# Patient Record
Sex: Female | Born: 1985 | Race: Black or African American | Hispanic: No | Marital: Single | Smoking: Never smoker
Health system: Southern US, Community
[De-identification: ages and names within clinical notes are randomized; demographics above are authoritative.]

## PROBLEM LIST (undated history)

## (undated) DIAGNOSIS — Z789 Other specified health status: Secondary | ICD-10-CM

## (undated) HISTORY — PX: DILATION AND CURETTAGE OF UTERUS: SHX78

---
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
## 2005-02-26 ENCOUNTER — Ambulatory Visit (HOSPITAL_COMMUNITY): Admission: RE | Admit: 2005-02-26 | Discharge: 2005-02-26 | Payer: Self-pay | Admitting: Obstetrics & Gynecology

## 2005-05-03 ENCOUNTER — Ambulatory Visit (HOSPITAL_COMMUNITY): Admission: RE | Admit: 2005-05-03 | Discharge: 2005-05-03 | Payer: Self-pay | Admitting: Obstetrics & Gynecology

## 2005-06-04 ENCOUNTER — Inpatient Hospital Stay (HOSPITAL_COMMUNITY): Admission: AD | Admit: 2005-06-04 | Discharge: 2005-06-04 | Payer: Self-pay | Admitting: Obstetrics & Gynecology

## 2005-06-19 ENCOUNTER — Inpatient Hospital Stay (HOSPITAL_COMMUNITY): Admission: AD | Admit: 2005-06-19 | Discharge: 2005-06-21 | Payer: Self-pay | Admitting: Obstetrics

## 2005-07-30 ENCOUNTER — Emergency Department (HOSPITAL_COMMUNITY): Admission: EM | Admit: 2005-07-30 | Discharge: 2005-07-30 | Payer: Self-pay | Admitting: Emergency Medicine

## 2006-01-09 ENCOUNTER — Inpatient Hospital Stay (HOSPITAL_COMMUNITY): Admission: EM | Admit: 2006-01-09 | Discharge: 2006-01-10 | Payer: Self-pay | Admitting: Emergency Medicine

## 2006-01-10 ENCOUNTER — Inpatient Hospital Stay (HOSPITAL_COMMUNITY): Admission: AD | Admit: 2006-01-10 | Discharge: 2006-01-13 | Payer: Self-pay | Admitting: Psychiatry

## 2006-01-10 ENCOUNTER — Ambulatory Visit: Payer: Self-pay | Admitting: Psychiatry

## 2006-03-05 ENCOUNTER — Encounter: Admission: RE | Admit: 2006-03-05 | Discharge: 2006-06-03 | Payer: Self-pay | Admitting: Obstetrics & Gynecology

## 2006-04-24 ENCOUNTER — Emergency Department (HOSPITAL_COMMUNITY): Admission: EM | Admit: 2006-04-24 | Discharge: 2006-04-24 | Payer: Self-pay | Admitting: Family Medicine

## 2006-05-27 ENCOUNTER — Inpatient Hospital Stay (HOSPITAL_COMMUNITY): Admission: AD | Admit: 2006-05-27 | Discharge: 2006-05-28 | Payer: Self-pay | Admitting: Obstetrics & Gynecology

## 2006-05-31 ENCOUNTER — Ambulatory Visit (HOSPITAL_COMMUNITY): Admission: RE | Admit: 2006-05-31 | Discharge: 2006-05-31 | Payer: Self-pay | Admitting: Obstetrics & Gynecology

## 2006-07-11 ENCOUNTER — Emergency Department (HOSPITAL_COMMUNITY): Admission: EM | Admit: 2006-07-11 | Discharge: 2006-07-11 | Payer: Self-pay | Admitting: Family Medicine

## 2006-07-12 ENCOUNTER — Emergency Department (HOSPITAL_COMMUNITY): Admission: EM | Admit: 2006-07-12 | Discharge: 2006-07-12 | Payer: Self-pay | Admitting: Emergency Medicine

## 2006-07-23 ENCOUNTER — Observation Stay (HOSPITAL_COMMUNITY): Admission: EM | Admit: 2006-07-23 | Discharge: 2006-07-23 | Payer: Self-pay | Admitting: Emergency Medicine

## 2006-11-04 ENCOUNTER — Inpatient Hospital Stay (HOSPITAL_COMMUNITY): Admission: AD | Admit: 2006-11-04 | Discharge: 2006-11-05 | Payer: Self-pay | Admitting: Obstetrics

## 2006-11-05 ENCOUNTER — Emergency Department (HOSPITAL_COMMUNITY): Admission: EM | Admit: 2006-11-05 | Discharge: 2006-11-05 | Payer: Self-pay | Admitting: Emergency Medicine

## 2008-08-04 ENCOUNTER — Emergency Department (HOSPITAL_COMMUNITY): Admission: EM | Admit: 2008-08-04 | Discharge: 2008-08-04 | Payer: Self-pay | Admitting: Emergency Medicine

## 2008-10-06 ENCOUNTER — Emergency Department (HOSPITAL_COMMUNITY): Admission: EM | Admit: 2008-10-06 | Discharge: 2008-10-06 | Payer: Self-pay | Admitting: Emergency Medicine

## 2009-05-30 ENCOUNTER — Emergency Department (HOSPITAL_COMMUNITY): Admission: EM | Admit: 2009-05-30 | Discharge: 2009-05-30 | Payer: Self-pay | Admitting: Family Medicine

## 2010-07-23 LAB — POCT URINALYSIS DIP (DEVICE)
Bilirubin Urine: NEGATIVE
Glucose, UA: NEGATIVE mg/dL
Ketones, ur: NEGATIVE mg/dL
Nitrite: NEGATIVE
Protein, ur: NEGATIVE mg/dL
Specific Gravity, Urine: 1.02 (ref 1.005–1.030)
Urobilinogen, UA: 1 mg/dL (ref 0.0–1.0)
pH: 6 (ref 5.0–8.0)

## 2010-07-23 LAB — POCT PREGNANCY, URINE: Preg Test, Ur: NEGATIVE

## 2010-08-14 LAB — URINE MICROSCOPIC-ADD ON

## 2010-08-14 LAB — GC/CHLAMYDIA PROBE AMP, GENITAL: GC Probe Amp, Genital: NEGATIVE

## 2010-08-14 LAB — URINALYSIS, ROUTINE W REFLEX MICROSCOPIC
Bilirubin Urine: NEGATIVE
Glucose, UA: NEGATIVE mg/dL
Hgb urine dipstick: NEGATIVE
Ketones, ur: NEGATIVE mg/dL
Nitrite: NEGATIVE
Specific Gravity, Urine: 1.018 (ref 1.005–1.030)
pH: 7.5 (ref 5.0–8.0)

## 2010-08-14 LAB — POCT PREGNANCY, URINE: Preg Test, Ur: NEGATIVE

## 2010-08-14 LAB — WET PREP, GENITAL

## 2010-08-17 LAB — CBC
RBC: 4.43 MIL/uL (ref 3.87–5.11)
WBC: 6.1 10*3/uL (ref 4.0–10.5)

## 2010-08-17 LAB — COMPREHENSIVE METABOLIC PANEL
ALT: 11 U/L (ref 0–35)
AST: 18 U/L (ref 0–37)
Alkaline Phosphatase: 26 U/L — ABNORMAL LOW (ref 39–117)
CO2: 25 mEq/L (ref 19–32)
Chloride: 104 mEq/L (ref 96–112)
GFR calc Af Amer: >60 mL/min (ref >60)
GFR calc non Af Amer: >60 mL/min (ref >60)
Sodium: 134 mEq/L — ABNORMAL LOW (ref 135–145)
Total Bilirubin: 0.8 mg/dL (ref 0.3–1.2)

## 2010-08-17 LAB — POCT I-STAT, CHEM 8
BUN: 10 mg/dL (ref 6–23)
Calcium, Ion: 1.17 mmol/L (ref 1.12–1.32)
HCT: 37 % (ref 36.0–46.0)
TCO2: 24 mmol/L (ref 0–100)

## 2010-08-17 LAB — DIFFERENTIAL
Basophils Absolute: 0 10*3/uL (ref 0.0–0.1)
Eosinophils Absolute: 0.5 10*3/uL (ref 0.0–0.7)
Eosinophils Relative: 9 % — ABNORMAL HIGH (ref 0–5)

## 2010-08-17 LAB — URINE MICROSCOPIC-ADD ON

## 2010-08-17 LAB — URINALYSIS, ROUTINE W REFLEX MICROSCOPIC
Nitrite: NEGATIVE
Specific Gravity, Urine: 1.025 (ref 1.005–1.030)
Urobilinogen, UA: 1 mg/dL (ref 0.0–1.0)
pH: 6 (ref 5.0–8.0)

## 2010-08-17 LAB — LIPASE, BLOOD: Lipase: 22 U/L (ref 11–59)

## 2010-08-17 LAB — GC/CHLAMYDIA PROBE AMP, GENITAL: GC Probe Amp, Genital: POSITIVE — AB

## 2010-08-17 LAB — URINE CULTURE: Colony Count: 40000

## 2010-08-17 LAB — WET PREP, GENITAL: Yeast Wet Prep HPF POC: NONE SEEN

## 2010-09-22 NOTE — H&P (Signed)
NAMESERAH, [name]               ACCOUNT NO.:  [id number]   MEDICAL RECORD NO.:  [id number]          PATIENT TYPE:  EMS   LOCATION:  MAJO                         FACILITY:  [facility]   PHYSICIAN:  Elliot Cousin, M.D.    DATE OF BIRTH:  [birth year]   DATE OF ADMISSION:  01/09/2006  DATE OF DISCHARGE:                                HISTORY & PHYSICAL   PRIMARY CARE PHYSICIAN:  Robyn N. Allyne Gee, M.D.   CHIEF COMPLAINT:  I overdosed on Extra-Strength Tylenol.   HISTORY OF PRESENT ILLNESS:  The patient is a 25 year old female with no  significant past medical history, who presents to the emergency department  after taking 15 Tylenol Extra-Strength tablets.  The patient states that she  took the pills at approximately 2 p.m. today.  She also drank a cup of vodka  to wash it down. The patient has been somewhat depressed because of a poor  relationship with her daughter's father.  Over the past few days, they have  been arguing quite a bit.  She denies any previous history of suicide  attempts.  She did have a bout of postpartum depression which she says did  not require pharmacological treatment.   After she took the Tylenol, she called her best friend and told her.  Her  best friend called the patient's neighbor who then called the patient's  mother.  Subsequently they called EMS.  When the patient arrived to the  emergency department, she was hemodynamically stable.  She was given  Mucomyst by the emergency department physician.  She will be admitted for  further evaluation and management.   PAST MEDICAL HISTORY:  Gravida 1, para 1.   MEDICATIONS:  Oral contraceptive agent once daily.   ALLERGIES:  No known drug allergies.   SOCIAL HISTORY:  The patient is single.  She [place]  She has one daughter.  She completed high  school.  She denies tobacco and illicit drug use.  She drinks alcohol  occasionally.   FAMILY HISTORY:  Mother is 65  years of age and has asthma.  Her father is 42  years of age and has no significant past medical history.   REVIEW OF SYSTEMS:  Otherwise negative.   REVIEW OF SYSTEMS:  The patient's review of systems is otherwise negative.   PHYSICAL EXAMINATION:  VITAL SIGNS:  Temperature 98, blood pressure 124/79,  pulse 82, respiratory rate 23.  Oxygen saturation 100% on room air.  GENERAL:  The patient is a pleasant, 25 year old, tall-framed, African  American lady who is currently lying in bed in no acute distress.  HEENT:  Head is normocephalic, atraumatic.  Pupils are equal, round and  reactive to light.  Extraocular movements were intact.  Sclerae mildly  icteric.  Conjunctivae clear.  Tympanic membranes bilaterally are clear.  Nasal mucosa is mildly dry.  No sinus tenderness.  Oropharynx reveals mildly  dry mucous membranes. No posterior exudates or erythema.  NECK:  Supple.  No adenopathy.  No thyromegaly.  No bruits.  No JVD.  LUNGS:  Decreased to  auscultation bilaterally.  HEART:  S1 and S2 with no murmurs, rubs or gallops.  ABDOMEN:  Hypoactive bowel sounds.  Soft.  Mildly tender in the epigastrium.  Nondistended. No hepatosplenomegaly.  GU/RECTAL:  Deferred.  EXTREMITIES:  Pedal pulses 2+ bilaterally.  No pretibial edema.  No pedal  edema.  NEUROLOGIC/PSYCHIATRIC:  The patient is alert and oriented x3.  Cranial  nerves II-XII intact. Sensation is intact.  Strength is 5/5 throughout.  She  is cooperative.  Her speech is clear.  She does, however, have somewhat of a  sad affect.   ADMISSION LABORATORY DATA:  Total bilirubin 3.1, direct bilirubin 0.3,  indirect bilirubin 2.8. Alkaline phosphatase 29. SGOT 14, SGPT 12.  Total  protein 7.3.  Albumin 4.3, creatinine 1.2.  sodium 137, potassium 3.6,  chloride 108, glucose 86, BUN 9, CO2 19.  Urine drug screen is negative.  Salicylate level is less than 4.  Acetaminophen level is 74.8.   ASSESSMENT:  1. Acetaminophen toxicity as a  consequence of intentional suicide attempt.  2. Hyperbilirubinemia.  3. Nausea.   PLAN:  1. The patient will be admitted for further evaluation and management.      She will be monitored on telemetry.  2. Mucomyst will be given for 17 doses.  3. IV fluids and supportive care.  4. Will consult psychiatry when the patient is medically stable.  5. Will monitor the patient's acetaminophen level q.4h. for 24 hours.      Will also follow up on her liver transaminases. Will check a CBC and a      TSH.      Elliot Cousin, M.D.  Electronically Signed     DF/MEDQ  D:  01/09/2006  T:  01/09/2006  Job:  [id number]   cc:   Candyce Churn. Allyne Gee, M.D.
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
## 2010-09-22 NOTE — Discharge Summary (Signed)
[name], [name]               ACCOUNT NO.:  [id number]   MEDICAL RECORD NO.:  [id number]          PATIENT TYPE:  INP   LOCATION:  2004                         FACILITY:  [facility]   PHYSICIAN:  Isidor Holts, M.D.  DATE OF BIRTH:  07-Apr-1986   DATE OF ADMISSION:  01/08/2006  DATE OF DISCHARGE:  01/10/2006                                 DISCHARGE SUMMARY   PMD:  Candyce Churn. Allyne Gee, M.D.   DISCHARGE DIAGNOSES:  1. Tylenol overdose/suicide attempt.  2. Depression.  3. Acetaminophen toxicity.   DISCHARGE MEDICATIONS:  None.   Patient has been cautioned to avoid Tylenol-containing medications for the  next two weeks.   PROCEDURES:  None.   CONSULTATIONS:  Dr. Antonietta Breach, psychiatrist.   ADMISSION HISTORY:  As in HPI note of January 09, 2006, dictated by Dr.  Elliot Cousin. However, in brief, this is a 25 year old female, with no  significant past medical history, who ingested 15 Tylenol Extra Strength,  washed down with a cup of vodka, following a period of depression, which she  describes as secondary to poor relationship with her daughter's father.   CLINICAL COURSE:  1. Tylenol overdose/acetaminophen toxicity:  Patient admittedly ingested      15 Tylenol Extra Strength tablets.  Initial acetaminophen level was      74.8.  LFTs were normal, with a total bilirubin of 3.1, direct      bilirubin 0.3, indirect bilirubin 2.8, alkaline phosphatase 25, AST 14,      ALT 12.  Patient has no history of alcohol abuse, and although she had ingested a cup  of vodka at the time of Tylenol ingestion, alcohol level was less than 3.  Salicylate level was also less than 4.  Urine drug screen was negative.  Patient was managed with a course of Mucomyst.  Serial acetaminophen levels  and LFTs were monitored.  Subsequent acetaminophen levels were less than 10.  Patient's case was discussed with [facility]  Recommendation was that no further acetaminophen levels needed  monitoring;  however, they recommended to continuing Mucomyst until a.m. of January 10, 2006, at which time INR was repeated and found to be 1.2,  LFTs remained  normal, and the [facility] recommended discontinuation of Mucomyst, after  completion of eight doses.  This is completed in a.m. of January 10, 2006.  The patient remains asymptomatic and has been recommended to avoid Tylenol  containing products for the next two weeks.   1. Depression/suicide attempt:  Patient was seen in consultation by Dr.      Antonietta Breach, psychiatrist.  It was recommended that once medically      cleared, she would benefit from transfer to the [facility], for a period of inpatient management.   DISPOSITION:  Patient was considered medically stable and sufficiently  recovered, to be medically cleared for transfer to [facility]  in the a.m. of January 10, 2006.  Arrangements have therefore been put in  place accordingly.      Isidor Holts, M.D.  Electronically Signed  CO/MEDQ  D:  01/10/2006  T:  01/10/2006  Job:  [id number]   cc:   Candyce Churn. Allyne Gee, M.D.  Antonietta Breach, M.D.

## 2010-09-22 NOTE — Op Note (Signed)
NAMEELOUISE, DIVELBISS               ACCOUNT NO.:  [id number]   MEDICAL RECORD NO.:  [id number]          PATIENT TYPE:  OBV   LOCATION:  2550                         FACILITY:  [facility]   PHYSICIAN:  Kinnie Scales. Annalee Genta, M.D.DATE OF BIRTH:  25-Jul-1985   DATE OF PROCEDURE:  07/23/2006  DATE OF DISCHARGE:  07/23/2006                               OPERATIVE REPORT   PRE AND POSTOPERATIVE DIAGNOSIS AND INDICATIONS FOR SURGERY:  Recurrent  right peritonsillar abscess.   SURGICAL PROCEDURES:  Incision and drainage, right peritonsillar  abscess.   SURGEON:  Kinnie Scales. Annalee Genta, M.D.   COMPLICATIONS:  None.   ANESTHESIA:  General endotracheal.   BLOOD LOSS:  Less than 50 mL.   The patient transferred to the operating room to recovery room in stable  condition.   BRIEF HISTORY:  Ms. Kronberg is a 25 year old black female who presented to  the [facility] emergency department with acute right sore  throat and otalgia.  The patient had a history of recurrent tonsillitis  and underwent incision and drainage of a right peritonsillar abscess on  07/12/2006 performed as an outpatient while awake.  She was discharge  with oral antibiotics and pain medication.  She was stable until the  morning of 07/23/2006.  She completed her course of antibiotics, began  to develop recurrent right sided otalgia, sore throat and tonsillar  swelling.  She presented to the [facility] emergency department  with trismus, fever and severe sore throat and otalgia.  Examination  revealed a right recurrent peritonsillar abscess.  CT scan was performed  in the emergency department which showed increased abscess within the  right peritonsillar fossa as well as scattered cervical lymphadenopathy.  Given the patient's history and prior procedure recommended we undertake  incision and drainage of peritonsillar abscess under general anesthesia  to ensure adequate drainage of the abscess.  The risks, benefits  and  possible complications of procedure were discussed in detail with the  patient and her boyfriend and they understood and concurred with our  plan for surgery which is scheduled on emergency basis at [facility]   SURGICAL PROCEDURES:  The patient brought the operating room at [facility] on 07/23/2006 and placed in supine position on the  operating table.  General endotracheal anesthesia was established  without difficulty.  The patient was adequately anesthetized a Crowe-  Davis mouth gag was inserted out difficulty.  She had 3+ cryptic tonsils  with erythema and exudate.  There was a right peritonsillar abscess with  fullness in the peritonsillar fossa.  Using Bovie electrocautery, a 0.5  cm curvilinear incision was created along the anterior tonsillar pillar.  This was carried through the mucosa and underlying submucosa to the  level of peritonsillar fossa where significant amount of purulent  material was then expressed from the fossa.  A curved tonsillar clamp  was then passed inferiorly and superiorly and the entire fossa was  dissected. There was no further abscess cavity or fluid buildup.  The  patient's oral  cavity, oropharynx was irrigated.  Crowe-Davis mouth gag released  and  removed.  She was then awakened from anesthetic and transferred from the  operating room to recovery in stable condition.  No complications.  Blood loss less than 50 mL.           ______________________________  Kinnie Scales. Annalee Genta, M.D.     DLS/MEDQ  D:  40/98/1191  T:  07/24/2006  Job:  [id number]

## 2010-09-22 NOTE — Discharge Summary (Signed)
[name], [name]               ACCOUNT NO.:  [id number]   MEDICAL RECORD NO.:  [id number]          PATIENT TYPE:  IPS   LOCATION:  0303                          FACILITY:  [facility]   PHYSICIAN:  Anselm Jungling, MD  DATE OF BIRTH:  [birth year]   DATE OF ADMISSION:  01/10/2006  DATE OF DISCHARGE:  01/13/2006                                 DISCHARGE SUMMARY   IDENTIFYING DATA AND REASON FOR ADMISSION:  The patient is a 25 year old  single African-American female admitted in the aftermath of a Tylenol  overdose.  She had been experiencing multiple stressors related to her 14-  month-old baby and the father of that child, with whom she was having some  conflict.  She had experienced excessive weight loss.  Please refer to the  admission note for further details pertaining to the symptoms, circumstances  and history that led to her hospitalization.  She was given an initial Axis  I diagnosis of depressive disorder NOS.   MEDICAL AND LABORATORY:  The patient was medically and physically assessed  by the psychiatric nurse practitioner.  She appeared to be in good health  and had been treated for her Tylenol overdose in the medical setting.  Routine laboratory was within normal limits.  There were no acute medical  issues.   HOSPITAL COURSE:  The patient was admitted to the adult inpatient  psychiatric service.  She presented as a well-nourished, normally-developed  female who was alert, oriented x4, but made little effort to communicate  with the undersigned in the initial assessment.  She stated in a petulant,  immature fashion, I want to go home.  There was nothing to suggest any  underlying psychosis.  She denied suicidal ideation.   On the second hospital day, the patient reported that she was feeling fine.  She predicted that she would not benefit from any further inpatient stay.  She was critical of the program.  She denied suicidal ideation.  There was  some behavioral escalation  after she was told that she would not be  discharged on that day.  The patient made statements indicating that she was  unable to view suicide as something that was not a good option for her  situation.  She did not act appropriately in group therapy.  She was not  felt to be appropriate for discharge that day.  Later that day, there was a  family meeting and her family agreed to be a strong support and help her.   Discharge occurred on the fourth hospital day.  On that day the patient  reported feeling much better.  She stated at that time that her overdose was  a mistake and voiced her opinion that suicide was not a viable option to  deal with problems in her life.  She agreed to follow up with [facility].   AFTERCARE:  The patient was discharged with a plan to follow up at [facility] with an appointment on January 16, 2006.  Also, the  patient was to be called by  our case manager with an appointment for [facility] for individual therapy.   The patient was discharged without psychotropic medication or other  medication.   DISCHARGE DIAGNOSES:  Axis I:  Mood disorder, not otherwise specified.  Axis II:  Cluster B personality traits.  Axis III:  Status post Tylenol overdose.  Axis I:  Stressors severe.  Axis V:  GAF on discharge 60.      Anselm Jungling, MD  Electronically Signed     SPB/MEDQ  D:  01/23/2006  T:  01/24/2006  Job:  [id number]

## 2011-02-20 LAB — GC/CHLAMYDIA PROBE AMP, GENITAL
Chlamydia, DNA Probe: NEGATIVE
GC Probe Amp, Genital: NEGATIVE

## 2011-02-20 LAB — WET PREP, GENITAL

## 2011-02-21 LAB — COMPREHENSIVE METABOLIC PANEL
Alkaline Phosphatase: 30 — ABNORMAL LOW
BUN: 7
CO2: 26
Chloride: 104
Glucose, Bld: 92
Potassium: 3.8
Total Bilirubin: 0.8

## 2011-02-21 LAB — CBC
HCT: 35.8 — ABNORMAL LOW
Hemoglobin: 11.6 — ABNORMAL LOW
RBC: 4.58
WBC: 5

## 2011-02-21 LAB — URINALYSIS, ROUTINE W REFLEX MICROSCOPIC
Bilirubin Urine: NEGATIVE
Glucose, UA: NEGATIVE
Hgb urine dipstick: NEGATIVE
Specific Gravity, Urine: >1.03 — ABNORMAL HIGH
Urobilinogen, UA: 1

## 2011-02-21 LAB — POCT PREGNANCY, URINE: Operator id: 251141

## 2013-11-02 ENCOUNTER — Inpatient Hospital Stay (HOSPITAL_COMMUNITY)
Admission: AD | Admit: 2013-11-02 | Discharge: 2013-11-02 | Disposition: A | Discharge status: Home / Self Care | Payer: Self-pay | Source: Ambulatory Visit | Attending: Obstetrics & Gynecology | Admitting: Obstetrics & Gynecology

## 2013-11-02 ENCOUNTER — Encounter (HOSPITAL_COMMUNITY): Payer: Self-pay | Admitting: *Deleted

## 2013-11-02 DIAGNOSIS — L738 Other specified follicular disorders: Secondary | ICD-10-CM | POA: Insufficient documentation

## 2013-11-02 DIAGNOSIS — A599 Trichomoniasis, unspecified: Secondary | ICD-10-CM

## 2013-11-02 DIAGNOSIS — A5901 Trichomonal vulvovaginitis: Secondary | ICD-10-CM | POA: Insufficient documentation

## 2013-11-02 DIAGNOSIS — L089 Local infection of the skin and subcutaneous tissue, unspecified: Secondary | ICD-10-CM

## 2013-11-02 HISTORY — DX: Other specified health status: Z78.9

## 2013-11-02 LAB — URINALYSIS, ROUTINE W REFLEX MICROSCOPIC
BILIRUBIN URINE: NEGATIVE
GLUCOSE, UA: NEGATIVE mg/dL
Ketones, ur: NEGATIVE mg/dL
Leukocytes, UA: NEGATIVE
NITRITE: NEGATIVE
PH: 7.5 (ref 5.0–8.0)
Protein, ur: NEGATIVE mg/dL
SPECIFIC GRAVITY, URINE: 1.01 (ref 1.005–1.030)
Urobilinogen, UA: 0.2 mg/dL (ref 0.0–1.0)

## 2013-11-02 LAB — URINE MICROSCOPIC-ADD ON

## 2013-11-02 LAB — POCT PREGNANCY, URINE: Preg Test, Ur: NEGATIVE

## 2013-11-02 MED ORDER — METRONIDAZOLE 500 MG PO TABS
2000.0000 mg | ORAL_TABLET | Freq: Once | ORAL | Status: AC
  Administered 2013-11-02: 2000 mg via ORAL
  Filled 2013-11-02: qty 4

## 2013-11-02 NOTE — MAU Provider Note (Signed)
History     CSN: [id number]  Arrival date and time: 11/02/13 1345   First Provider Initiated Contact with Patient 11/02/13 1521      Chief Complaint  Patient presents with  . Blister   HPI Ms. Jannetta QuintSabrina A Tissue is a 28 y.o. [id number] who presents to MAU today with complaint of a small bump on her labia. The patient states that she first noted the bump on Saturday and feels that it has become slightly more red and irritated since then. She states that it is itchy but not really painful. She denies drainage, surrounding redness, fever, UTI symptoms or vaginal discharge today. She states LMP of 10/31/13. She is currently sexually active with a female partner.    OB History   Grav Para Term Preterm Abortions TAB SAB Ect Mult Living   3 1 1  2 1 1   1       Past Medical History  Diagnosis Date  . Medical history non-contributory     Past Surgical History  Procedure Laterality Date  . Dilation and curettage of uterus      History reviewed. No pertinent family history.  History  Substance Use Topics  . Smoking status: Never Smoker   . Smokeless tobacco: Never Used  . Alcohol Use: Yes     Comment: Socially    Allergies:  Allergies  Allergen Reactions  . Sulfa Antibiotics Hives and Swelling  . Tomato Other (See Comments)    "Lips and throat itch and swell"    Prescriptions prior to admission  Medication Sig Dispense Refill  . cetirizine (ZYRTEC) 10 MG tablet Take 10 mg by mouth as needed for allergies.      Sharlet Salina. Guarana, Paullinia cupana, (GUARANA PO) Take 1 capsule by mouth daily.        Review of Systems  Constitutional: Negative for fever and malaise/fatigue.  Gastrointestinal: Negative for nausea, vomiting, abdominal pain, diarrhea and constipation.  Genitourinary: Negative for dysuria, urgency and frequency.       + vaginal bleeding Neg - vaginal discharge  Skin: Positive for itching and rash.   Physical Exam   Blood pressure 122/84, pulse 69, temperature 98.2  F (36.8 C), temperature source Oral, resp. rate 16, height 5' 11.5" (1.816 m), weight 161 lb 12.8 oz (73.392 kg), last menstrual period 10/31/2013, SpO2 100.00%.  Physical Exam  Constitutional: She is oriented to person, place, and time. She appears well-developed and well-nourished. No distress.  HENT:  Head: Normocephalic and atraumatic.  Cardiovascular: Normal rate.   Respiratory: Effort normal.  Neurological: She is alert and oriented to person, place, and time.  Skin: Skin is warm and dry. No erythema.     Psychiatric: She has a normal mood and affect.   Results for orders placed during the hospital encounter of 11/02/13 (from the past 24 hour(s))  URINALYSIS, ROUTINE W REFLEX MICROSCOPIC     Status: Abnormal   Collection Time    11/02/13  3:12 PM      Result Value Ref Range   Color, Urine YELLOW  YELLOW   APPearance CLEAR  CLEAR   Specific Gravity, Urine 1.010  1.005 - 1.030   pH 7.5  5.0 - 8.0   Glucose, UA NEGATIVE  NEGATIVE mg/dL   Hgb urine dipstick SMALL (*) NEGATIVE   Bilirubin Urine NEGATIVE  NEGATIVE   Ketones, ur NEGATIVE  NEGATIVE mg/dL   Protein, ur NEGATIVE  NEGATIVE mg/dL   Urobilinogen, UA 0.2  0.0 - 1.0 mg/dL  Nitrite NEGATIVE  NEGATIVE   Leukocytes, UA NEGATIVE  NEGATIVE  URINE MICROSCOPIC-ADD ON     Status: Abnormal   Collection Time    11/02/13  3:12 PM      Result Value Ref Range   Squamous Epithelial / LPF RARE  RARE   WBC, UA 3-6  <3 WBC/hpf   RBC / HPF 0-2  <3 RBC/hpf   Bacteria, UA FEW (*) RARE   Urine-Other TRICHOMONAS PRESENT    POCT PREGNANCY, URINE     Status: None   Collection Time    11/02/13  3:16 PM      Result Value Ref Range   Preg Test, Ur NEGATIVE  NEGATIVE     MAU Course  Procedures None  MDM UPT - negative UA today 2 G Flagyl given in MAU today  Assessment and Plan  A: Ingrown hair follicle on the left labia Trichomonas infection  P: Discharge home Patient treated in MAU. Advised partner treatment and  thorough cleaning of any toys used Patient advised to use warm compresses and sitz baths for labial growth Patient may return to MAU as needed or if her condition were to change or worsen   Freddi StarrJulie N Ethier, PA-C  11/02/2013, 3:21 PM

## 2013-11-02 NOTE — Discharge Instructions (Signed)
Ingrown Hair An ingrown hair is a hair that curls and re-enters the skin instead of growing straight out of the skin. It happens most often with curly hair. It is usually more severe in the neck area, but it can occur in any shaved area, including the beard area, groin, scalp, and legs. An ingrown hair may cause small pockets of infection. CAUSES  Shaving closely, tweezing, or waxing, especially curly hair. Using hair removal creams can sometimes lead to ingrown hairs, especially in the groin. SYMPTOMS   Small bumps on the skin. The bumps may be filled with pus.  Pain.  Itching. DIAGNOSIS  Your caregiver can usually tell what is wrong by doing a physical exam. TREATMENT  If there is a severe infection, your caregiver may prescribe antibiotic medicines. Laser hair removal may also be done to help prevent regrowth of the hair. HOME CARE INSTRUCTIONS   Do not shave irritated skin. You may start shaving again once the irritation has gone away.  If you are prone to ingrown hairs, consider not shaving as much as possible.  If antibiotics are prescribed, take them as directed. Finish them even if you start to feel better.  You may use a facial sponge in a gentle circular motion to help dislodge ingrown hairs on the face.  You may use a hair removal cream weekly, especially on the legs and underarms. Stop using the cream if it irritates your skin. Use caution when using hair removal creams in the groin area. SHAVING INSTRUCTIONS AFTER TREATMENT  Shower before shaving. Keep areas to be shaved packed in warm, moist wraps for several minutes before shaving. The warm, moist environment helps soften the hairs and makes ingrown hairs less likely to occur.  Use thick shaving gels.  Use a bump fighter razor that cuts hair slightly above the skin level or use an electric shaver with a longer shave setting.  Shave in the direction of hair growth. Avoid making multiple razor strokes.  Use  moisturizing lotions after shaving. Document Released: 07/30/2000 Document Revised: 10/23/2011 Document Reviewed: 07/24/2011 [facility] Patient Information 2015 [place], [place]. This information is not intended to replace advice given to you by your health care provider. Make sure you discuss any questions you have with your health care provider. Trichomoniasis Trichomoniasis is an infection caused by an organism called Trichomonas. The infection can affect both women and men. In women, the outer female genitalia and the vagina are affected. In men, the penis is mainly affected, but the prostate and other reproductive organs can also be involved. Trichomoniasis is a sexually transmitted infection (STI) and is most often passed to another person through sexual contact.  RISK FACTORS  Having unprotected sexual intercourse.  Having sexual intercourse with an infected partner. SIGNS AND SYMPTOMS  Symptoms of trichomoniasis in women include:  Abnormal gray-green frothy vaginal discharge.  Itching and irritation of the vagina.  Itching and irritation of the area outside the vagina. Symptoms of trichomoniasis in men include:   Penile discharge with or without pain.  Pain during urination. This results from inflammation of the urethra. DIAGNOSIS  Trichomoniasis may be found during a Pap test or physical exam. Your health care provider may use one of the following methods to help diagnose this infection:  Examining vaginal discharge under a microscope. For men, urethral discharge would be examined.  Testing the pH of the vagina with a test tape.  Using a vaginal swab test that checks for the Trichomonas organism. A test is available  that provides results within a few minutes.  Doing a culture test for the organism. This is not usually needed. TREATMENT   You may be given medicine to fight the infection. Women should inform their health care provider if they could be or are pregnant. Some  medicines used to treat the infection should not be taken during pregnancy.  Your health care provider may recommend over-the-counter medicines or creams to decrease itching or irritation.  Your sexual partner will need to be treated if infected. HOME CARE INSTRUCTIONS   Take all medicine prescribed by your health care provider.  Take over-the-counter medicine for itching or irritation as directed by your health care provider.  Do not have sexual intercourse while you have the infection.  Women should not douche or wear tampons while they have the infection.  Discuss your infection with your partner. Your partner may have gotten the infection from you, or you may have gotten it from your partner.  Have your sex partner get examined and treated if necessary.  Practice safe, informed, and protected sex.  See your health care provider for other STI testing. SEEK MEDICAL CARE IF:   You still have symptoms after you finish your medicine.  You develop abdominal pain.  You have pain when you urinate.  You have bleeding after sexual intercourse.  You develop a rash.  Your medicine makes you sick or makes you throw up (vomit). Document Released: 10/17/2000 Document Revised: 04/28/2013 Document Reviewed: 02/02/2013 [facility] Patient Information 2015 [place], [place]. This information is not intended to replace advice given to you by your health care provider. Make sure you discuss any questions you have with your health care provider.

## 2013-11-02 NOTE — MAU Note (Signed)
Assumed care of patient.

## 2013-11-02 NOTE — MAU Note (Signed)
Patient has an Implanon that is about 1 1/2 years expired.

## 2013-11-02 NOTE — MAU Note (Signed)
Patient state she started having a blister like area on the mons on Saturday. Not really painful but itching and irritating.

## 2013-11-10 ENCOUNTER — Inpatient Hospital Stay (HOSPITAL_COMMUNITY)
Admission: AD | Admit: 2013-11-10 | Discharge: 2013-11-11 | Disposition: A | Discharge status: Home / Self Care | Payer: Self-pay | Source: Ambulatory Visit | Attending: Obstetrics & Gynecology | Admitting: Obstetrics & Gynecology

## 2013-11-10 ENCOUNTER — Encounter (HOSPITAL_COMMUNITY): Payer: Self-pay

## 2013-11-10 DIAGNOSIS — X58XXXA Exposure to other specified factors, initial encounter: Secondary | ICD-10-CM | POA: Insufficient documentation

## 2013-11-10 DIAGNOSIS — S3141XA Laceration without foreign body of vagina and vulva, initial encounter: Secondary | ICD-10-CM

## 2013-11-10 DIAGNOSIS — D509 Iron deficiency anemia, unspecified: Secondary | ICD-10-CM | POA: Insufficient documentation

## 2013-11-10 DIAGNOSIS — S3140XA Unspecified open wound of vagina and vulva, initial encounter: Secondary | ICD-10-CM | POA: Insufficient documentation

## 2013-11-10 DIAGNOSIS — R109 Unspecified abdominal pain: Secondary | ICD-10-CM | POA: Insufficient documentation

## 2013-11-10 DIAGNOSIS — Y92009 Unspecified place in unspecified non-institutional (private) residence as the place of occurrence of the external cause: Secondary | ICD-10-CM | POA: Insufficient documentation

## 2013-11-10 LAB — URINALYSIS, ROUTINE W REFLEX MICROSCOPIC
Bilirubin Urine: NEGATIVE
Glucose, UA: NEGATIVE mg/dL
Ketones, ur: 15 mg/dL — AB
Leukocytes, UA: NEGATIVE
NITRITE: NEGATIVE
PH: 6 (ref 5.0–8.0)
Protein, ur: NEGATIVE mg/dL
Specific Gravity, Urine: 1.015 (ref 1.005–1.030)
Urobilinogen, UA: 0.2 mg/dL (ref 0.0–1.0)

## 2013-11-10 LAB — URINE MICROSCOPIC-ADD ON

## 2013-11-10 NOTE — MAU Note (Signed)
Pt reports heavy vaginal bleeding for the last hour, pt reports lower abd cramping started after the bleeding. LMP 06/26 11/04/2013/2015 to

## 2013-11-11 DIAGNOSIS — R109 Unspecified abdominal pain: Secondary | ICD-10-CM

## 2013-11-11 DIAGNOSIS — S3140XA Unspecified open wound of vagina and vulva, initial encounter: Secondary | ICD-10-CM

## 2013-11-11 DIAGNOSIS — D509 Iron deficiency anemia, unspecified: Secondary | ICD-10-CM

## 2013-11-11 LAB — CBC
HCT: 30.2 % — ABNORMAL LOW (ref 36.0–46.0)
HEMOGLOBIN: 8.9 g/dL — AB (ref 12.0–15.0)
MCH: 20.2 pg — ABNORMAL LOW (ref 26.0–34.0)
MCHC: 29.5 g/dL — AB (ref 30.0–36.0)
MCV: 68.6 fL — AB (ref 78.0–100.0)
Platelets: 178 10*3/uL (ref 150–400)
RBC: 4.4 MIL/uL (ref 3.87–5.11)
RDW: 17.7 % — ABNORMAL HIGH (ref 11.5–15.5)
WBC: 4.5 10*3/uL (ref 4.0–10.5)

## 2013-11-11 LAB — POCT PREGNANCY, URINE: PREG TEST UR: NEGATIVE

## 2013-11-11 MED ORDER — OXYCODONE-ACETAMINOPHEN 5-325 MG PO TABS
2.0000 | ORAL_TABLET | ORAL | Status: AC
  Administered 2013-11-11: 2 via ORAL
  Filled 2013-11-11: qty 2

## 2013-11-11 MED ORDER — IBUPROFEN 600 MG PO TABS: 600.0000 mg | ORAL_TABLET | Freq: Four times a day (QID) | ORAL | Status: DC | PRN

## 2013-11-11 NOTE — Discharge Instructions (Signed)
Periurethral laceration  1.  Keep area clean and dry 2.  Do not use any creams or ointments 3.  Use ice on affected area for 24 hours, then sitz bath or warm bath in tub Ok to help healing.   4.  Return to MAU if bleeding resumes  Iron Deficiency Anemia Anemia is a condition in which there are less red blood cells or hemoglobin in the blood than normal. Hemoglobin is the part of red blood cells that carries oxygen. Iron deficiency anemia is anemia caused by too little iron. It is the most common type of anemia. It may leave you tired and short of breath. CAUSES   Lack of iron in the diet.  Poor absorption of iron, as seen with intestinal disorders.  Intestinal bleeding.  Heavy periods. SIGNS AND SYMPTOMS  Mild anemia may not be noticeable. Symptoms may include:  Fatigue.  Headache.  Pale skin.  Weakness.  Tiredness.  Shortness of breath.  Dizziness.  Cold hands and feet.  Fast or irregular heartbeat. DIAGNOSIS  Diagnosis requires a thorough evaluation and physical exam by your health care provider. Blood tests are generally used to confirm iron deficiency anemia. Additional tests may be done to find the underlying cause of your anemia. These may include:  Testing for blood in the stool (fecal occult blood test).  A procedure to see inside the colon and rectum (colonoscopy).  A procedure to see inside the esophagus and stomach (endoscopy). TREATMENT  Iron deficiency anemia is treated by correcting the cause of the deficiency. Treatment may involve:  Adding iron-rich foods to your diet.  Taking iron supplements. Pregnant or breastfeeding women need to take extra iron because their normal diet usually does not provide the required amount.  Taking vitamins. Vitamin C improves the absorption of iron. Your health care provider may recommend that you take your iron tablets with a glass of orange juice or vitamin C supplement.  Medicines to make heavy menstrual flow  lighter.  Surgery. HOME CARE INSTRUCTIONS   Take iron as directed by your health care provider.  If you cannot tolerate taking iron supplements by mouth, talk to your health care provider about taking them through a vein (intravenously) or an injection into a muscle.  For the best iron absorption, iron supplements should be taken on an empty stomach. If you cannot tolerate them on an empty stomach, you may need to take them with food.  Do not drink milk or take antacids at the same time as your iron supplements. Milk and antacids may interfere with the absorption of iron.  Iron supplements can cause constipation. Make sure to include fiber in your diet to prevent constipation. A stool softener may also be recommended.  Take vitamins as directed by your health care provider.  Eat a diet rich in iron. Foods high in iron include liver, lean beef, whole-grain bread, eggs, dried fruit, and dark green leafy vegetables. SEEK IMMEDIATE MEDICAL CARE IF:   You faint. If this happens, do not drive. Call your local emergency services (911 in [place] if no other help is available.  You have chest pain.  You feel nauseous or vomit.  You have severe or increased shortness of breath with activity.  You feel weak.  You have a rapid heartbeat.  You have unexplained sweating.  You become light-headed when getting up from a chair or bed. MAKE SURE YOU:   Understand these instructions.  Will watch your condition.  Will get help right away if  you are not doing well or get worse. Document Released: 04/20/2000 Document Revised: 04/28/2013 Document Reviewed: 12/29/2012 [facility] Patient Information 2015 [place], [place]. This information is not intended to replace advice given to you by your health care provider. Make sure you discuss any questions you have with your health care provider.

## 2013-11-11 NOTE — MAU Provider Note (Signed)
Chief Complaint: Vaginal Bleeding and Abdominal Cramping   First Provider Initiated Contact with Patient 11/11/13 0017     SUBJECTIVE HPI: [name] is a 28 y.o. [id number] who presents to maternity admissions reporting onset of bright red vaginal bleeding and abdominal cramping after intercourse. She is currently sexually active with a female partner and was using a toy tonight that she has used before without complication.  She reports she was bleeding heavily "like a crime scene" with large clots and what looked like tissue.  By the time she arrived in MAU the bleeding has slowed down.  She has scant bleeding on her pad at this time.  She was diagnosed on 11/02/13 with trichomonas. She has known hx of anemia.  She and her partner were treated at that time.  She denies vaginal itching/burning, urinary symptoms, h/a, dizziness, n/v, SOB, or fever/chills.     Past Medical History  Diagnosis Date  . Medical history non-contributory    Past Surgical History  Procedure Laterality Date  . Dilation and curettage of uterus     History   Social History  . Marital Status: Single    Spouse Name: N/A    Number of Children: N/A  . Years of Education: N/A   Occupational History  . Not on file.   Social History Main Topics  . Smoking status: Never Smoker   . Smokeless tobacco: Never Used  . Alcohol Use: Yes     Comment: Socially  . Drug Use: Yes    Special: Marijuana  . Sexual Activity: Yes    Birth Control/ Protection: Implant     Comment: implanon placed 2010   Other Topics Concern  . Not on file   Social History Narrative  . No narrative on file   No current facility-administered medications on file prior to encounter.   Current Outpatient Prescriptions on File Prior to Encounter  Medication Sig Dispense Refill  . cetirizine (ZYRTEC) 10 MG tablet Take 10 mg by mouth as needed for allergies.      Sharlet Salina. Guarana, Paullinia cupana, (GUARANA PO) Take 1 capsule by mouth daily.        Allergies  Allergen Reactions  . Sulfa Antibiotics Hives and Swelling  . Tomato Other (See Comments)    "Lips and throat itch and swell"    ROS: Pertinent items in HPI  OBJECTIVE Blood pressure 112/66, pulse 86, temperature 97.9 F (36.6 C), temperature source Oral, resp. rate 18, height 5\' 11"  (1.803 m), weight 72.122 kg (159 lb), last menstrual period 10/31/2013, SpO2 99.00%. GENERAL: Well-developed, well-nourished female in no acute distress.  HEENT: Normocephalic HEART: normal rate RESP: normal effort ABDOMEN: Soft, non-tender EXTREMITIES: Nontender, no edema NEURO: Alert and oriented Pelvic exam: Cervix pink, visually closed, without lesion, scant white creamy discharge, no blood in vaginal vault, vaginal walls normal.  Periurethral laceration, 1cm in length noted, hemostatic at this time with dried blood on surrounding labia.  Laceration remains hemostatic and well approximated with manipulation during exam.   Bimanual exam: Cervix 0/long/high, firm, anterior, neg CMT, uterus nontender, nonenlarged, adnexa without tenderness, enlargement, or mass  LAB RESULTS Results for orders placed during the hospital encounter of 11/10/13 (from the past 24 hour(s))  URINALYSIS, ROUTINE W REFLEX MICROSCOPIC     Status: Abnormal   Collection Time    11/10/13 11:35 PM      Result Value Ref Range   Color, Urine YELLOW  YELLOW   APPearance CLEAR  CLEAR   Specific Gravity,  Urine 1.015  1.005 - 1.030   pH 6.0  5.0 - 8.0   Glucose, UA NEGATIVE  NEGATIVE mg/dL   Hgb urine dipstick LARGE (*) NEGATIVE   Bilirubin Urine NEGATIVE  NEGATIVE   Ketones, ur 15 (*) NEGATIVE mg/dL   Protein, ur NEGATIVE  NEGATIVE mg/dL   Urobilinogen, UA 0.2  0.0 - 1.0 mg/dL   Nitrite NEGATIVE  NEGATIVE   Leukocytes, UA NEGATIVE  NEGATIVE  URINE MICROSCOPIC-ADD ON     Status: None   Collection Time    11/10/13 11:35 PM      Result Value Ref Range   Squamous Epithelial / LPF RARE  RARE   WBC, UA 0-2  <3 WBC/hpf    RBC / HPF 11-20  <3 RBC/hpf   Bacteria, UA RARE  RARE  POCT PREGNANCY, URINE     Status: None   Collection Time    11/10/13 11:45 PM      Result Value Ref Range   Preg Test, Ur NEGATIVE  NEGATIVE    ASSESSMENT 1. Vaginal laceration, initial encounter   2. Iron deficiency anemia     PLAN Percocet 5/325 x2 tabs in MAU Discharge home Ibuprofen 600 mg PO Q 6 hours PRN sent to pharmacy Take OTC women's multivitamin or PNV daily for iron replacement  Follow-up Information   [facility] (As needed for emergencies)    Contact information:   [address] [id number] [place] [address] [phone]      Follow [facility] with [facility]. (As needed)    Specialty:  Obstetrics and Gynecology   Contact information:   [address] [place] [address] [phone]      Sharen CounterLisa Leftwich-Kirby Certified Nurse-Midwife 11/11/2013  1:02 AM

## 2013-11-12 NOTE — MAU Provider Note (Signed)
Attestation of Attending Supervision of Advanced Practitioner (CNM/NP): Evaluation and management procedures were performed by the Advanced Practitioner under my supervision and collaboration. I have reviewed the Advanced Practitioner's note and chart, and I agree with the management and plan.  Jered Heiny H. 3:48 PM     

## 2014-03-08 ENCOUNTER — Encounter (HOSPITAL_COMMUNITY): Payer: Self-pay

## 2014-04-27 ENCOUNTER — Inpatient Hospital Stay (HOSPITAL_COMMUNITY)
Admission: AD | Admit: 2014-04-27 | Discharge: 2014-04-28 | Disposition: A | Discharge status: Home / Self Care | Payer: Self-pay | Source: Ambulatory Visit | Attending: Family Medicine | Admitting: Family Medicine

## 2014-04-27 DIAGNOSIS — B373 Candidiasis of vulva and vagina: Secondary | ICD-10-CM | POA: Insufficient documentation

## 2014-04-27 DIAGNOSIS — B3731 Acute candidiasis of vulva and vagina: Secondary | ICD-10-CM

## 2014-04-28 ENCOUNTER — Encounter (HOSPITAL_COMMUNITY): Payer: Self-pay | Admitting: *Deleted

## 2014-04-28 LAB — URINALYSIS, ROUTINE W REFLEX MICROSCOPIC
Bilirubin Urine: NEGATIVE
Glucose, UA: NEGATIVE mg/dL
Hgb urine dipstick: NEGATIVE
Ketones, ur: NEGATIVE mg/dL
LEUKOCYTES UA: NEGATIVE
NITRITE: NEGATIVE
Protein, ur: NEGATIVE mg/dL
Urobilinogen, UA: 0.2 mg/dL (ref 0.0–1.0)
pH: 5.5 (ref 5.0–8.0)

## 2014-04-28 LAB — POCT PREGNANCY, URINE: Preg Test, Ur: NEGATIVE

## 2014-04-28 LAB — WET PREP, GENITAL: Trich, Wet Prep: NONE SEEN

## 2014-04-28 LAB — GC/CHLAMYDIA PROBE AMP
CT Probe RNA: NEGATIVE
GC PROBE AMP APTIMA: NEGATIVE

## 2014-04-28 LAB — HIV ANTIBODY (ROUTINE TESTING W REFLEX): HIV 1&2 Ab, 4th Generation: NONREACTIVE

## 2014-04-28 MED ORDER — FLUCONAZOLE 150 MG PO TABS: 150.0000 mg | ORAL_TABLET | Freq: Once | ORAL | Status: DC

## 2014-04-28 NOTE — Discharge Instructions (Signed)

## 2014-04-28 NOTE — MAU Provider Note (Signed)
History     CSN: [id number]  Arrival date and time: 04/27/14 2358   First Provider Initiated Contact with Patient 04/28/14 0032      Chief Complaint  Patient presents with  . Vaginal Pain   HPI This is a 28 y.o. female who presents with c/o vaginal itching and irritation. States using cream on outside did nothelp. Wants STD testing.    RN Note:  Expand All Collapse All   PT SAYS SHE HAS VAG ITCHING - STARTED ON Sunday. ON Monday- USED WASH AND MONISTAT- LESS ITCHING THEN DRYNESS . IRRITATION STARTED TODAY- ITCHING STARTED AGAIN. HAS HX OF YEAST INF. NO GYN- DR. HAS INPLANON - INSERTED ? DOESN'T WANT IT ANYMORE. LAST SEX- LAST NIGHT.         OB History    Gravida Para Term Preterm AB TAB SAB Ectopic Multiple Living   3 1 1  2 1 1   1       Past Medical History  Diagnosis Date  . Medical history non-contributory     Past Surgical History  Procedure Laterality Date  . Dilation and curettage of uterus      Family History  Problem Relation Age of Onset  . Asthma Mother   . Cancer Father   . Asthma Sister   . Asthma Brother   . Diabetes Maternal Aunt   . Diabetes Maternal Uncle   . Diabetes Maternal Grandmother   . Diabetes Maternal Grandfather   . Hypertension Paternal Grandmother   . Hypertension Paternal Grandfather     History  Substance Use Topics  . Smoking status: Never Smoker   . Smokeless tobacco: Never Used  . Alcohol Use: Yes     Comment: Socially    Allergies:  Allergies  Allergen Reactions  . Sulfa Antibiotics Hives and Swelling  . Tomato Other (See Comments)    "Lips and throat itch and swell"    Prescriptions prior to admission  Medication Sig Dispense Refill Last Dose  . cetirizine (ZYRTEC) 10 MG tablet Take 10 mg by mouth as needed for allergies.   11/09/2013 at Unknown time  . Guarana, Paullinia cupana, (GUARANA PO) Take 1 capsule by mouth daily.   11/09/2013 at  Unknown time  . ibuprofen (ADVIL,MOTRIN) 600 MG tablet Take 1 tablet (600 mg total) by mouth every 6 (six) hours as needed. 30 tablet 0     Review of Systems  Constitutional: Negative for fever, chills and malaise/fatigue.  Gastrointestinal: Negative for nausea, vomiting and abdominal pain.  Genitourinary:       Vaginal itching    Physical Exam   Blood pressure 114/69, pulse 76, temperature 98 F (36.7 C), temperature source Oral, resp. rate 20, height 5\' 9"  (1.753 m), weight 165 lb 8 oz (75.07 kg), last menstrual period 04/08/2014.  Physical Exam  Constitutional: She is oriented to person, place, and time. She appears well-developed and well-nourished. No distress.  Cardiovascular: Normal rate.   Respiratory: Effort normal.  GI: Soft. There is no tenderness. There is no rebound and no guarding.  Genitourinary: Vaginal discharge (thin white) found.  No lesions or significant erethema   Musculoskeletal: Normal range of motion.  Neurological: She is alert and oriented to person, place, and time.  Skin: Skin is warm and dry.  Psychiatric: She has a normal mood and affect.    MAU Course  Procedures  MDM Results for orders placed or performed during the hospital encounter of 04/27/14 (from the past 24 hour(s))  Urinalysis, Routine  w reflex microscopic     Status: Abnormal   Collection Time: 04/28/14 12:18 AM  Result Value Ref Range   Color, Urine YELLOW YELLOW   APPearance CLEAR CLEAR   Specific Gravity, Urine >1.030 (H) 1.005 - 1.030   pH 5.5 5.0 - 8.0   Glucose, UA NEGATIVE NEGATIVE mg/dL   Hgb urine dipstick NEGATIVE NEGATIVE   Bilirubin Urine NEGATIVE NEGATIVE   Ketones, ur NEGATIVE NEGATIVE mg/dL   Protein, ur NEGATIVE NEGATIVE mg/dL   Urobilinogen, UA 0.2 0.0 - 1.0 mg/dL   Nitrite NEGATIVE NEGATIVE   Leukocytes, UA NEGATIVE NEGATIVE  Wet prep, genital     Status: Abnormal   Collection Time: 04/28/14 12:55 AM  Result Value Ref Range   Yeast Wet Prep HPF POC FEW  (A) NONE SEEN   Trich, Wet Prep NONE SEEN NONE SEEN   Clue Cells Wet Prep HPF POC FEW (A) NONE SEEN   WBC, Wet Prep HPF POC MODERATE (A) NONE SEEN  Pregnancy, urine POC     Status: None   Collection Time: 04/28/14  1:12 AM  Result Value Ref Range   Preg Test, Ur NEGATIVE NEGATIVE     Assessment and Plan  A:  Yeast vaginitis  P:  Discussed findings       Rx Diflucan       [facility]  [facility] Inter[facility] ServicesWILLIAMS,Nester Bachus 04/28/2014, 12:54 AM

## 2014-04-28 NOTE — MAU Note (Signed)
PT  SAYS SHE HAS VAG  ITCHING -  STARTED  ON Sunday.    ON Monday-  USED  WASH   AND MONISTAT-     LESS ITCHING  THEN   DRYNESS    .  IRRITATION  STARTED  TODAY-  ITCHING   STARTED  AGAIN.     HAS HX  OF YEAST INF.      NO   GYN-      DR.    HAS INPLANON -  INSERTED   ?    DOESN'T    WANT IT  ANYMORE.     LAST SEX-     LAST  NIGHT.

## 2014-04-28 NOTE — MAU Note (Signed)
Pt states she noticed pain and itching on Sunday. Last night pt states she is feeling really dry.Itching stopped but this morning but pt states she has had discharge today

## 2014-09-20 ENCOUNTER — Emergency Department (HOSPITAL_COMMUNITY)
Admission: EM | Admit: 2014-09-20 | Discharge: 2014-09-20 | Disposition: A | Discharge status: Home / Self Care | Payer: Self-pay | Attending: Emergency Medicine | Admitting: Emergency Medicine

## 2014-09-20 DIAGNOSIS — L02211 Cutaneous abscess of abdominal wall: Secondary | ICD-10-CM | POA: Insufficient documentation

## 2014-09-20 DIAGNOSIS — L0291 Cutaneous abscess, unspecified: Secondary | ICD-10-CM

## 2014-09-20 DIAGNOSIS — Z79899 Other long term (current) drug therapy: Secondary | ICD-10-CM | POA: Insufficient documentation

## 2014-09-20 MED ORDER — LIDOCAINE-EPINEPHRINE (PF) 2 %-1:200000 IJ SOLN
20.0000 mL | Freq: Once | INTRAMUSCULAR | Status: AC
  Administered 2014-09-20: 20 mL via INTRADERMAL
  Filled 2014-09-20: qty 20

## 2014-09-20 NOTE — ED Notes (Signed)
Pt states that she started to have an infected area on her R lower abdomen approx. 5 days ago. Painful. Not draining. Alert and oriented.

## 2014-09-20 NOTE — Discharge Instructions (Signed)
Do not hesitate to return to the emergency room for any new, worsening or concerning symptoms.  Please obtain primary care using resource guide below. Let them know that you were seen in the emergency room and that they will need to obtain records for further outpatient management.  If you see signs of worsening infection (warmth, redness, tenderness, pus, sharp increase in pain, fever, red streaking) immediately return to the emergency department.    Abscess Care After An abscess (also called a boil or furuncle) is an infected area that contains a collection of pus. Signs and symptoms of an abscess include pain, tenderness, redness, or hardness, or you may feel a moveable soft area under your skin. An abscess can occur anywhere in the body. The infection may spread to surrounding tissues causing cellulitis. A cut (incision) by the surgeon was made over your abscess and the pus was drained out. Gauze may have been packed into the space to provide a drain that will allow the cavity to heal from the inside outwards. The boil may be painful for 5 to 7 days. Most people with a boil do not have high fevers. Your abscess, if seen early, may not have localized, and may not have been lanced. If not, another appointment may be required for this if it does not get better on its own or with medications. HOME CARE INSTRUCTIONS   Only take over-the-counter or prescription medicines for pain, discomfort, or fever as directed by your caregiver.  When you bathe, soak and then remove gauze or iodoform packs at least daily or as directed by your caregiver. You may then wash the wound gently with mild soapy water. Repack with gauze or do as your caregiver directs. SEEK IMMEDIATE MEDICAL CARE IF:   You develop increased pain, swelling, redness, drainage, or bleeding in the wound site.  You develop signs of generalized infection including muscle aches, chills, fever, or a general ill feeling.  An oral temperature  above 102 F (38.9 C) develops, not controlled by medication. See your caregiver for a recheck if you develop any of the symptoms described above. If medications (antibiotics) were prescribed, take them as directed. Document Released: 11/09/2004 Document Revised: 07/16/2011 Document Reviewed: 07/07/2007 [facility] Patient Information 2015 [place], [place]. This information is not intended to replace advice given to you by your health care provider. Make sure you discuss any questions you have with your health care provider.   Emergency Department Resource Guide 1) Find a Doctor and Pay Out of Pocket Although you won't have to find out who is covered by your insurance plan, it is a good idea to ask around and get recommendations. You will then need to call the office and see if the doctor you have chosen will accept you as a new patient and what types of options they offer for patients who are self-pay. Some doctors offer discounts or will set up payment plans for their patients who do not have insurance, but you will need to ask so you aren't surprised when you get to your appointment.  2) Contact Your Local Health Department Not all health departments have doctors that can see patients for sick visits, but many do, so it is worth a call to see if yours does. If you don't know where your local health department is, you can check in your phone book. The CDC also has a tool to help you locate your state's health department, and many state websites also have listings of all of their  local health departments.  3) Find a Walk-in Clinic If your illness is not likely to be very severe or complicated, you may want to try a walk in clinic. These are popping up all over the country in pharmacies, drugstores, and shopping centers. They're usually staffed by nurse practitioners or physician assistants that have been trained to treat common illnesses and complaints. They're usually fairly quick and inexpensive.  However, if you have serious medical issues or chronic medical problems, these are probably not your best option.  No Primary Care Doctor: - Call Health Connect at  [phone] - they can help you locate a primary care doctor that  accepts your insurance, provides certain services, etc. - Physician Referral Service- [phone]  Chronic Pain Problems: Organization         Address  Phone   Notes  [facility]  [phone] Patients need to be referred by their primary care doctor.   Medication Assistance: Organization         Address  Phone   Notes  [facility] Medication [facility] [address] [place], [place] [address] [phone] --Must be a resident of [facility] -- Must have NO insurance coverage whatsoever (no Medicaid/ Medicare, etc.) -- The pt. MUST have a primary care doctor that directs their care regularly and follows them in the community   [facility]ist  [phone]   [employer]  [phone]    Agencies that provide inexpensive medical care: Organization         Address  Phone   Notes  [facility]  [phone]   [facility]    [phone]   [facility] [address] [place], [place] [address] [phone]   [facility] [address] [place], [place] [phone]   Planned Parenthood    [phone]   [facility]    [phone]   [facility]  [address], [place] Phone:  [phone], Fax:  [phone] Hours of Operation:  9 am - 6 pm, M-F.  Also accepts Medicaid/Medicare and self-pay.  [facility]  [address], [place] Phone: [phone], Fax: [phone]. Hours of Operation:  8:30 am - 5:30 pm, M-F.  Also accepts Medicaid and self-pay.  [facility] [place] [address], [place] Phone: [phone]   [facility] [address] Natasha Bence [place], [place] [phone], Ext. [phone] Mondays & Thursdays: 7-9 AM.  First 15 patients are seen on a first come, first serve basis.    Medicaid-accepting [facility] Providers:  Organization         Address  Phone   Notes  [facility] [address], [place] [phone] Also accepts self-pay patients.  [facility] [address] Laurell Josephs [place], [place]  [phone]   [facility] [address], [place] [phone]   [facility] [address] [place] [phone]   Renaye Rakers [address], [place]   [phone] Only accepts [place] Access [place] patients after they have their name applied to their card.   Self-Pay (no insurance) in [place]:  Organization         Address  Phone   Notes  Sickle Cell Patients,  [facility] [address], [place] [phone]) [phone]   [facility] [address], [place] [phone]   [facility] [place]  [address], [place] [phone]   [facility]/Dr. Osei-Bonsu  [address] [place] [address], [place] or [phone], [place] [phone]) [phone] Phone number for both [place] and [place] locations is the same.  [facility] [address], [place] [phone]   [facility] [place] [address] [place] [address], [place] or [address] [phone]) [phone]   [facility] [address], [place] [phone], phone; [phone](336) [phone], fax Sees patients 1st and 3rd Saturday of every month.  Must not qualify for public or private insurance (i.e. Medicaid, Medicare, [place] Health Choice, Veterans' Benefits)  Household income should be no more than 200% of the poverty level The clinic cannot treat you if you are pregnant or think  you are pregnant  Sexually transmitted diseases are not treated at the clinic.    Dental Care: Organization         Address  Phone  Notes  [facility] [address], [place] [phone] Accepts children up to age 29 who are enrolled in [place]icaid or [place] Health Choice; pregnant women with a Medicaid card; and children who have applied for Medicaid or [place] Health Choice, but were declined, whose parents can pay a reduced fee at time of service.  [facility] [place]  [address], [place] [phone]) [phone] Accepts children up to age 29 who are enrolled in [place] or [place] Health Choice; pregnant women with a Medicaid card; and children who have applied for Medicaid or [place] Health Choice, but were declined, whose parents can pay a reduced fee at time of service.  [facility]  [address], [place] [phone]) [phone] Patients are seen by appointment only. Walk-ins are not accepted. [facility] will see patients 29 years of age and older. Monday - Tuesday (8am-5pm) Most Wednesdays (8:30-5pm) $30 per visit, cash only  [facility]  [address], [facility] [place] [phone]) [phone] Patients are seen by appointment only. Walk-ins are not accepted. [facility] will see patients 29 years of age and older. One Wednesday Evening (Monthly: Volunteer Based).  $30 per visit, cash only  [employer] [facility]  [phone] for adults; Children under age 304, call Graduate Pediatric Dentistry at [phone]. Children aged 164-14, please call [phone] to request a pediatric application.  Dental services are provided in all areas of dental care including fillings, crowns and bridges, complete and partial dentures, implants, gum treatment, root canals, and extractions. Preventive care is also provided. Treatment is provided to both adults and  children. Patients are selected via a lottery and there is often a waiting list.   [facility] [address], [place]  [phone] [email or web address]   [facility] [address], [place], [place] [phone], Ext. [phone] Second and Fourth Thursday of each month, opens at 6:30 AM; Clinic ends at 9 AM.  Patients are seen on a first-come first-served basis, and a limited number are seen during each clinic.   [facility]  [address], [place], [place] [phone]) [phone]   Eligibility Requirements You must  have lived in [place], [place], or [place] counties for at least the last three months.   You cannot be eligible for state or federal sponsored [employer], including [employer], [place], or [employer].   You generally cannot be eligible for healthcare insurance through your employer.    How to apply: Eligibility screenings are held every Tuesday and Wednesday afternoon from 1:00 pm until 4:00 pm. You do not need an appointment for the interview!  [facility] [address], [place], [place]   [place]  [place] Department  [place]  [phone]    Behavioral Health Resources in the Community: Intensive Outpatient Programs Organization         Address  Phone  Notes  [place]. [address] [place], [place] [phone]   [facility] Outpatient [address] [place], [place]   ADS: [facility] [address] [place], [place]   [place] [address],  [place], [place] or [phone]   Substance Abuse Resources Organization         Address  Phone  Notes  Alcohol and Drug Services  [phone]   [place]  [phone]   [facility]     Chinita Pester  [phone]   Residential & Outpatient Substance Abuse Program  [phone]   Psychological Services Organization         Address  Phone  Notes  [facility]  [place]  [phone]   [place] [address], [place] or [phone]    Mobile Crisis Teams Organization         Address  Phone  Notes  [facility], Mobile Crisis Care Unit  [phone]   [facility]  [address] [place], [place]   [place] Levels [address] [place] [phone]    Self-Help/Support Groups Organization         Address  Phone             Notes  [place]. of [place] - variety of support groups  [place] Call for more information  Narcotics Anonymous (NA), [facility] [address], [employer] [place]  2 meetings at this location   Special educational needs teacher         Address  Phone  Notes  [facility] [place],    [place]  [phone]   [facility]  [address], [place] [address], [place], [place]   [place], [place] [phone] Admissions: 8am-3pm M-F  Incentives Substance [place] [address]    [place], [place] [phone]   [facility] [address] [place], [place], [place]   [facility] [address],  [place], [place]   [facility] Programs - Intensive Outpatient [address], [place], [place]   [facility] ([place].,  [place], [place] or [phone]   [facility] (RTS) [address] [place], [place] Accepts Medicaid  [facility] [address],  [place] [phone] Substance Abuse/Addiction Treatment   [facility] Resources Organization         Address  Phone  Notes  [facility]  [phone]   Domenic Schwab, PhD [address] Arlis Porta [place], [place]   [phone] or [phone]   [facility], [place] [phone]   [address], [place], [place] [phone] Insurance/Medicaid/sponsorship through [facility] and Families [address], [address]                                    [place], [place] [phone] [place] [address], [place] [phone]    Dr. Adele Schilder  [phone]   [facility] Dept. 1) [address] [place] [address] [place] 2) [place] 3)  [place] [address], [place] [phone]  [phone]   [place] [phone] or [phone] (After Hours)

## 2014-09-20 NOTE — ED Provider Notes (Signed)
CSN: [id number]     Arrival date & time 09/20/14  1758 History  This chart was scribed for non-physician practitioner, Wynetta EmeryNicole Griffen Frayne, PA-C working with Eber HongBrian Miller, MD, by Jarvis Morganaylor Ferguson, ED Scribe. This patient was seen in room WTR6/WTR6 and the patient's care was started at 7:10 PM.    Chief Complaint  Patient presents with  . Skin Problem    The history is provided by the patient. No language interpreter was used.    HPI Comments: [name] is a 29 y.o. female who presents to the Emergency Department complaining of an painful, red, raised bump to her left lower abdomen for 5 days. She is complaining of associated HA. Pt denies h/o abscess She denies any h/o DM. She denies any fever or drainage from the area. Pain is significant, 8 out of 10 and exacerbated by movement and palpation.  Past Medical History  Diagnosis Date  . Medical history non-contributory    Past Surgical History  Procedure Laterality Date  . Dilation and curettage of uterus     Family History  Problem Relation Age of Onset  . Asthma Mother   . Cancer Father   . Asthma Sister   . Asthma Brother   . Diabetes Maternal Aunt   . Diabetes Maternal Uncle   . Diabetes Maternal Grandmother   . Diabetes Maternal Grandfather   . Hypertension Paternal Grandmother   . Hypertension Paternal Grandfather    History  Substance Use Topics  . Smoking status: Never Smoker   . Smokeless tobacco: Never Used  . Alcohol Use: Yes     Comment: Socially   OB History    Gravida Para Term Preterm AB TAB SAB Ectopic Multiple Living   3 1 1  2 1 1   1      Review of Systems A complete 10 system review of systems was obtained and all systems are negative except as noted in the HPI and PMH.     Allergies  Sulfa antibiotics and Tomato  Home Medications   Prior to Admission medications   Medication Sig Start Date End Date Taking? Authorizing Provider  cetirizine (ZYRTEC) 10 MG tablet Take 10 mg by mouth as needed  for allergies.    Historical Provider, MD  fluconazole (DIFLUCAN) 150 MG tablet Take 1 tablet (150 mg total) by mouth once. 04/28/14   Aviva SignsMarie L Williams, CNM  Guarana, Paullinia cupana, (GUARANA PO) Take 1 capsule by mouth daily.    Historical Provider, MD  ibuprofen (ADVIL,MOTRIN) 600 MG tablet Take 1 tablet (600 mg total) by mouth every 6 (six) hours as needed. 11/11/13   Wilmer FloorLisa A Leftwich-Kirby, CNM   Triage Vitals: BP 114/78 mmHg  Pulse 74  Temp(Src) 97.5 F (36.4 C) (Oral)  SpO2 100%  LMP 09/12/2014  Physical Exam  Constitutional: She is oriented to person, place, and time. She appears well-developed and well-nourished. No distress.  HENT:  Head: Normocephalic and atraumatic.  Eyes: Conjunctivae and EOM are normal.  Neck: Neck supple. No tracheal deviation present.  Cardiovascular: Normal rate.   Pulmonary/Chest: Effort normal. No respiratory distress.  Abdominal: Soft. Bowel sounds are normal. She exhibits no distension and no mass. There is no tenderness. There is no rebound and no guarding.  Musculoskeletal: Normal range of motion.  Neurological: She is alert and oriented to person, place, and time.  Skin: Skin is warm and dry.  1.5 cm fluctuant abscess to right lower quadrant of abdomen, no significant surrounding cellulitis, no drainage. Hair  is shaved in the region.   Psychiatric: She has a normal mood and affect. Her behavior is normal.  Nursing note and vitals reviewed.   ED Course  INCISION AND DRAINAGE Date/Time: 09/20/2014 9:13 PM Performed by: Wynetta EmeryPISCIOTTA, Audie Stayer Authorized by: Wynetta EmeryPISCIOTTA, Reo Portela Consent: Verbal consent obtained. Consent given by: patient Patient identity confirmed: verbally with patient Type: abscess Body area: trunk Location details: abdomen Anesthesia: local infiltration Local anesthetic: lidocaine 2% with epinephrine Anesthetic total: 3 ml Patient sedated: no Scalpel size: 11 Incision type: single straight Complexity: simple Drainage:  purulent Drainage amount: moderate Wound treatment: wound left open Packing material: none Patient tolerance: Patient tolerated the procedure well with no immediate complications   (including critical care time)  DIAGNOSTIC STUDIES: Oxygen Saturation is 100% on RA, normal by my interpretation.    COORDINATION OF CARE:  Labs Review Labs Reviewed - No data to display  Imaging Review No results found.   EKG Interpretation None      MDM   Final diagnoses:  Abscess    Filed Vitals:   09/20/14 1819  BP: 114/78  Pulse: 74  Temp: 97.5 F (36.4 C)  TempSrc: Oral  SpO2: 100%    Medications  lidocaine-EPINEPHrine (XYLOCAINE W/EPI) 2 %-1:200000 (PF) injection 20 mL (20 mLs Intradermal Given by Other 09/20/14 1947)    Julia QuintSabrina A Mcgaugh is a pleasant 29 y.o. female presenting with fluctuant abscess on right lower abdomen. Likely secondary to ingrown hair. IND performed with no complications, minimal surrounding cellulitis no indication for antibiotics assistive discussion of wound care and return precautions.   Evaluation does not show pathology that would require ongoing emergent intervention or inpatient treatment. Pt is hemodynamically stable and mentating appropriately. Discussed findings and plan with patient/guardian, who agrees with care plan. All questions answered. Return precautions discussed and outpatient follow up given.   I personally performed the services described in this documentation, which was scribed in my presence. The recorded information has been reviewed and is accurate.     Wynetta Emeryicole Danelia Snodgrass, PA-C 09/21/14 1039  Eber HongBrian Miller, MD 09/21/14 1501

## 2017-04-02 ENCOUNTER — Emergency Department (HOSPITAL_COMMUNITY): Payer: Self-pay

## 2017-04-02 ENCOUNTER — Other Ambulatory Visit: Payer: Self-pay

## 2017-04-02 ENCOUNTER — Emergency Department (HOSPITAL_COMMUNITY)
Admission: EM | Admit: 2017-04-02 | Discharge: 2017-04-02 | Disposition: A | Discharge status: Home / Self Care | Payer: Self-pay | Attending: Emergency Medicine | Admitting: Emergency Medicine

## 2017-04-02 ENCOUNTER — Encounter (HOSPITAL_COMMUNITY): Payer: Self-pay

## 2017-04-02 DIAGNOSIS — M79672 Pain in left foot: Secondary | ICD-10-CM | POA: Insufficient documentation

## 2017-04-02 DIAGNOSIS — Z79899 Other long term (current) drug therapy: Secondary | ICD-10-CM | POA: Insufficient documentation

## 2017-04-02 NOTE — Discharge Instructions (Signed)
Your x-rays today showed no evidence of fracture from her foot injury.  Please use the postop shoe and crutches to help with the pain when you walk.  Please use ice and anti-inflammatory medications.  Please follow-up with a PCP for reevaluation.  If any symptoms change or worsen, please return to the nearest emergency department.

## 2017-04-02 NOTE — ED Provider Notes (Signed)
[facility]-EMERGENCY DEPT Provider Note   CSN: [id number] Arrival date & time: 04/02/17  0907     History   Chief Complaint Chief Complaint  Patient presents with  . Toe Injury    HPI [name] is a 31 y.o. female.  The history is provided by the patient and medical records. No language interpreter was used.  Foot Injury   The incident occurred 12 to 24 hours ago. The incident occurred at home. The injury mechanism was a direct blow. The pain is present in the left toes and left foot. The quality of the pain is described as aching. The pain is at a severity of 7/10. The pain is severe. The pain has been constant since onset. Associated symptoms include inability to bear weight. Pertinent negatives include no numbness and no loss of motion. She reports no foreign bodies present. The symptoms are aggravated by bearing weight and palpation. She has tried NSAIDs for the symptoms. The treatment provided mild relief.    Past Medical History:  Diagnosis Date  . Medical history non-contributory     There are no active problems to display for this patient.   Past Surgical History:  Procedure Laterality Date  . DILATION AND CURETTAGE OF UTERUS      OB History    Gravida Para Term Preterm AB Living   3 1 1   2 1    SAB TAB Ectopic Multiple Live Births   1 1     1        Home Medications    Prior to Admission medications   Medication Sig Start Date End Date Taking? Authorizing Provider  cetirizine (ZYRTEC) 10 MG tablet Take 10 mg by mouth as needed for allergies.    [provider]  fluconazole (DIFLUCAN) 150 MG tablet Take 1 tablet (150 mg total) by mouth once. 04/28/14   Aviva SignsWilliams, Marie L, CNM  Guarana, Paullinia cupana, (GUARANA PO) Take 1 capsule by mouth daily.    [provider]  ibuprofen (ADVIL,MOTRIN) 600 MG tablet Take 1 tablet (600 mg total) by mouth every 6 (six) hours as needed. 11/11/13   Leftwich-Kirby, Wilmer FloorLisa A, CNM     Family History Family History  Problem Relation Age of Onset  . Asthma Mother   . Cancer Father   . Asthma Sister   . Asthma Brother   . Diabetes Maternal Aunt   . Diabetes Maternal Uncle   . Diabetes Maternal Grandmother   . Diabetes Maternal Grandfather   . Hypertension Paternal Grandmother   . Hypertension Paternal Grandfather     Social History Social History   Tobacco Use  . Smoking status: Never Smoker  . Smokeless tobacco: Never Used  Substance Use Topics  . Alcohol use: Yes    Comment: Socially  . Drug use: No     Allergies   Sulfa antibiotics and Tomato   Review of Systems Review of Systems  Constitutional: Negative for chills.  Respiratory: Negative for chest tightness.   Cardiovascular: Negative for chest pain.  Gastrointestinal: Negative for abdominal pain, nausea and vomiting.  Genitourinary: Negative for flank pain.  Musculoskeletal: Negative for back pain.  Skin: Negative for rash and wound.  Neurological: Negative for numbness and headaches.  All other systems reviewed and are negative.    Physical Exam Updated Vital Signs BP 124/77   Pulse 76   Temp (!) 97.4 F (36.3 C) (Oral)   Resp 16   Ht 5' 11.5" (1.816 m)  Wt 79.4 kg (175 lb)   LMP 04/02/2017   SpO2 99%   BMI 24.07 kg/m   Physical Exam  Constitutional: She appears well-developed and well-nourished. No distress.  HENT:  Head: Normocephalic and atraumatic.  Eyes: Conjunctivae are normal.  Cardiovascular: Normal rate.  No murmur heard. Pulmonary/Chest: Effort normal and breath sounds normal. No respiratory distress. She exhibits no tenderness.  Abdominal: Soft. There is no tenderness.  Musculoskeletal: She exhibits tenderness. She exhibits no edema.       Left foot: There is tenderness. There is normal range of motion, no swelling, normal capillary refill, no crepitus, no deformity and no laceration.       Feet:  Neurological: She is alert. No sensory deficit. She  exhibits normal muscle tone.  Skin: Skin is warm and dry. Capillary refill takes less than 2 seconds. No rash noted. She is not diaphoretic. No erythema.  Nursing note and vitals reviewed.    ED Treatments / Results  Labs (all labs ordered are listed, but only abnormal results are displayed) Labs Reviewed - No data to display  EKG  EKG Interpretation None       Radiology Dg Foot Complete Left  Result Date: 04/02/2017 CLINICAL DATA:  Struck left foot / 1st - 3rd toes on bed 04/01/17 PM ; pain 1st -3rd metatarsals ; patient shielded for imaging EXAM: LEFT FOOT - COMPLETE 3+ VIEW COMPARISON:  None. FINDINGS: There is no evidence of fracture or dislocation. There is no evidence of arthropathy or other focal bone abnormality. Soft tissues are unremarkable. IMPRESSION: No acute osseous injury of the left foot. Electronically Signed   By: Elige Ko   On: 04/02/2017 10:49    Procedures Procedures (including critical care time)  Medications Ordered in ED Medications - No data to display   Initial Impression / Assessment and Plan / ED Course  I have reviewed the triage vital signs and the nursing notes.  Pertinent labs & imaging results that were available during my care of the patient were reviewed by me and considered in my medical decision making (see chart for details).     [name] is a 31 y.o. female with a past medical history of prior left foot fracture several years ago who presents with left foot injury.  Patient says that last night she struck her left toes and foot on the corner of a bed.  She says that she went to bed and took some ibuprofen with minimal pain relief.  She says this morning she was having such pain with bearing weight that she decided to come get evaluated.  She reports the pain is a 7 out of 10 in severity and constant.  It is an aching pain.  She is unable to bear weight due to the discomfort.  She denies laceration or skin injury.  She denies any  ankle pain, other knee pain or hip pain.  She denies any other injuries or complaints.  She says the pain is staying right at the base of the second and third toes on the left foot.  She denies any right foot injury.  Next  On exam, patient has tenderness at the base of the left second and third toes.  No laceration seen.  Patient is able to wiggle her toes with discomfort.  Patient has normal capillary refill in toes.  Normal sensation in foot.  Normal DP and PT pulse.  No pain with ankle manipulation or movement.  No knee tenderness or  leg tenderness otherwise.  Lungs clear.  No murmurs appreciated.  Patient with x-rays of the foot to look for fractures or injuries.  Doubt ankle injury due to lack of pain with ankle movement and the direct blow mechanism.  Anticipate reassessment after x-rays.  11:53 AM X-rays revealed no evidence of fracture.  Despite reassuring x-rays, patient will be given postop shoe and crutches due to the discomfort when she tries to ambulate.  Patient may have occult fracture that was missed today due to the tenderness.  Patient will follow up with her PCP and maintain rice therapy.  Patient understood return precautions and management instructions.  Patient had no depressions or concerns and was discharged in good condition.   Final Clinical Impressions(s) / ED Diagnoses   Final diagnoses:  Left foot pain    ED Discharge Orders    None      Clinical Impression: 1. Left foot pain     Disposition: Discharge  Condition: Good  I have discussed the results, Dx and Tx plan with the pt(& family if present). He/she/they expressed understanding and agree(s) with the plan. Discharge instructions discussed at great length. Strict return precautions discussed and pt &/or family have verbalized understanding of the instructions. No further questions at time of discharge.    This SmartLink is deprecated. Use AVSMEDLIST instead to display the medication list for a  patient.  Follow Up: [facility] [address] [place] [id number] Schedule an appointment as soon as possible for a visit    [facility] [address] [id number] [place] [phone]  If symptoms worsen     Tegeler, Canary Brimhristopher J, MD 04/02/17 1905
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
## 2017-04-02 NOTE — ED Notes (Signed)
Patient transported to X-ray 

## 2017-04-02 NOTE — ED Notes (Signed)
Instructions given to pt about discharge care. Pt expressed understanding.  Sign pad unable to capture signature.

## 2018-02-18 ENCOUNTER — Encounter (HOSPITAL_COMMUNITY): Payer: Self-pay | Admitting: Emergency Medicine

## 2018-02-18 ENCOUNTER — Emergency Department (HOSPITAL_COMMUNITY)
Admission: EM | Admit: 2018-02-18 | Discharge: 2018-02-18 | Disposition: A | Discharge status: Home / Self Care | Payer: Self-pay | Attending: Emergency Medicine | Admitting: Emergency Medicine

## 2018-02-18 ENCOUNTER — Emergency Department (HOSPITAL_COMMUNITY): Payer: Self-pay

## 2018-02-18 DIAGNOSIS — Z79899 Other long term (current) drug therapy: Secondary | ICD-10-CM | POA: Insufficient documentation

## 2018-02-18 DIAGNOSIS — M25562 Pain in left knee: Secondary | ICD-10-CM | POA: Insufficient documentation

## 2018-02-18 DIAGNOSIS — R600 Localized edema: Secondary | ICD-10-CM | POA: Insufficient documentation

## 2018-02-18 DIAGNOSIS — R52 Pain, unspecified: Secondary | ICD-10-CM

## 2018-02-18 MED ORDER — ACETAMINOPHEN 500 MG PO TABS
1000.0000 mg | ORAL_TABLET | Freq: Once | ORAL | Status: AC
  Administered 2018-02-18: 1000 mg via ORAL
  Filled 2018-02-18: qty 2

## 2018-02-18 NOTE — Discharge Instructions (Addendum)
Your x-ray shows no evidence of fracture or dislocation.  You may have some ligamentous strain or injury to your knee.  Please continue taking Tylenol and ibuprofen regularly for pain, ice and elevate the knee as much as possible.  Use knee immobilizer and crutches and try and stay off of the knee.  If your pain persists you will need to follow-up with Dr. Farris Has with orthopedics for further evaluation.  Return to the emergency department if you develop significantly worsening pain, redness, swelling or warmth over the knee, fevers or any other new or concerning symptoms.

## 2018-02-18 NOTE — ED Triage Notes (Signed)
Pt reports during sleep started having left knee pain. Reports pain worse today. Pt comes in wearing brace from home. Reports that she had ACL tear years ago.

## 2018-02-18 NOTE — ED Provider Notes (Signed)
[facility]-EMERGENCY DEPT Provider Note   CSN: [id number] Arrival date & time: 02/18/18  1439     History   Chief Complaint Chief Complaint  Patient presents with  . Knee Pain    HPI [name] is a 32 y.o. female.  [name] is a 32 y.o. Female who is otherwise healthy, presents to the emergency department for evaluation of left knee pain.  She reports she first noticed the pain yesterday when she was stepping out of a car, pain was primarily present over the lateral aspect of her knee.  She reports overnight she started to notice the pain more and it persisted and worsened today.  She reports pain is significantly worse with movement or weightbearing.  She reports several years ago she had an injury to her ACL and had a patellar dislocation but never had to have surgery denies any other history of injuries to the knee.  She does not noticed any redness or warmth, reports it seems like it is mildly swollen.  She denies any pain at the hip or ankle.  No other obvious trauma or strenuous activity involving the knee recently.  No numbness tingling or weakness in the left leg.  She took 1 dose of ibuprofen this afternoon just prior to arrival but has not tried anything else to treat her symptoms.  In the past is seen Dr. Farris Has with [facility] regarding her knee issues.     Past Medical History:  Diagnosis Date  . Medical history non-contributory     There are no active problems to display for this patient.   Past Surgical History:  Procedure Laterality Date  . DILATION AND CURETTAGE OF UTERUS       OB History    Gravida  3   Para  1   Term  1   Preterm      AB  2   Living  1     SAB  1   TAB  1   Ectopic      Multiple      Live Births  1            Home Medications    Prior to Admission medications   Medication Sig Start Date End Date Taking? Authorizing Provider  ECHINACEA PO Take 1 tablet by mouth daily.   Yes  [provider]  ELDERBERRY PO Take 1 tablet by mouth daily.   Yes [provider]  ibuprofen (ADVIL,MOTRIN) 800 MG tablet Take 800 mg by mouth every 8 (eight) hours as needed for fever or headache.   Yes [provider]  SPIRULINA PO Take 1 tablet by mouth daily.   Yes [provider]  TURMERIC PO Take 1 tablet by mouth daily.   Yes [provider]    Family History Family History  Problem Relation Age of Onset  . Asthma Mother   . Cancer Father   . Asthma Sister   . Asthma Brother   . Diabetes Maternal Aunt   . Diabetes Maternal Uncle   . Diabetes Maternal Grandmother   . Diabetes Maternal Grandfather   . Hypertension Paternal Grandmother   . Hypertension Paternal Grandfather     Social History Social History   Tobacco Use  . Smoking status: Never Smoker  . Smokeless tobacco: Never Used  Substance Use Topics  . Alcohol use: Yes    Comment: Socially  . Drug use: No     Allergies  Sulfa antibiotics and Tomato   Review of Systems Review of Systems  Constitutional: Negative for chills and fever.  Musculoskeletal: Positive for arthralgias and joint swelling.  Skin: Negative for color change, rash and wound.  Neurological: Negative for weakness and numbness.     Physical Exam Updated Vital Signs BP 110/88 (BP Location: Left Arm)   Pulse 65   Temp 97.8 F (36.6 C) (Oral)   Resp 18   LMP 02/02/2018   SpO2 100%   Physical Exam  Constitutional: She appears well-developed and well-nourished. No distress.  HENT:  Head: Normocephalic and atraumatic.  Eyes: Right eye exhibits no discharge. Left eye exhibits no discharge.  Pulmonary/Chest: Effort normal. No respiratory distress.  Musculoskeletal:  Redness to palpation over the lateral aspect of the left knee, there is a small amount of swelling around the patella but no overlying erythema or warmth, patient is able to bend and extend the knee but range of motion is  limited by pain.  The patella appears somewhat lax and easily movable but does not appear displaced, there is no obvious joint laxity.  She has no tenderness to palpation at the hip or ankle.  2+ DP and PT pulses.  Sensation is intact, 5/5 strength with dorsi and plantar flexion.  Neurological: She is alert. Coordination normal.  Skin: She is not diaphoretic.  Psychiatric: She has a normal mood and affect. Her behavior is normal.  Nursing note and vitals reviewed.    ED Treatments / Results  Labs (all labs ordered are listed, but only abnormal results are displayed) Labs Reviewed - No data to display  EKG None  Radiology Dg Knee 4 Views W/patella Left  Result Date: 02/18/2018 CLINICAL DATA:  Acute onset knee pain while sleeping. EXAM: LEFT KNEE - COMPLETE 4+ VIEW COMPARISON:  None. FINDINGS: No evidence of fracture, dislocation, or joint effusion. No evidence of arthropathy or other focal bone abnormality. Soft tissues are unremarkable. IMPRESSION: Negative. Electronically Signed   By: Obie Dredge M.D.   On: 02/18/2018 17:13    Procedures Procedures (including critical care time)  Medications Ordered in ED Medications  acetaminophen (TYLENOL) tablet 1,000 mg (1,000 mg Oral Given 02/18/18 1629)     Initial Impression / Assessment and Plan / ED Course  I have reviewed the triage vital signs and the nursing notes.  Pertinent labs & imaging results that were available during my care of the patient were reviewed by me and considered in my medical decision making (see chart for details).  Patient presents to the emergency department for evaluation of left knee pain which started yesterday after stepping out of his vehicle and worsened overnight.  She reports some slight swelling but no overlying redness or warmth and no fevers, exam not concerning for septic arthritis.  No recent instrumentation.  Patient does have remote history of patellar dislocation and ACL strain.  Pain  primarily over the lateral aspect range of motion limited by pain, neurovascularly intact.  X-ray shows no evidence of joint effusion, fracture or dislocation.  Will place patient in knee immobilizer with crutches have her continue with ibuprofen, Tylenol, ice and elevation and follow-up with Dr. Farris Has at [facility] orthopedics who she seen before for her knee injuries.  Work note provided, return precautions discussed.  Patient expresses understanding and is in agreement with plan.  Stable for discharge home at this time.  Final Clinical Impressions(s) / ED Diagnoses   Final diagnoses:  Acute pain of left knee  ED Discharge Orders    None       Dartha Lodge, [place] 02/18/18 1722    Gwyneth Sprout, MD 02/18/18 [phone]

## 2019-03-30 IMAGING — CR DG KNEE COMPLETE 4+V*L*
5 series · 5 of 5 positions shown · non-contrast
Comparison: None.

CLINICAL DATA: Acute onset knee pain while sleeping.

EXAM:
LEFT KNEE - COMPLETE 4+ VIEW

[t knee ap left]
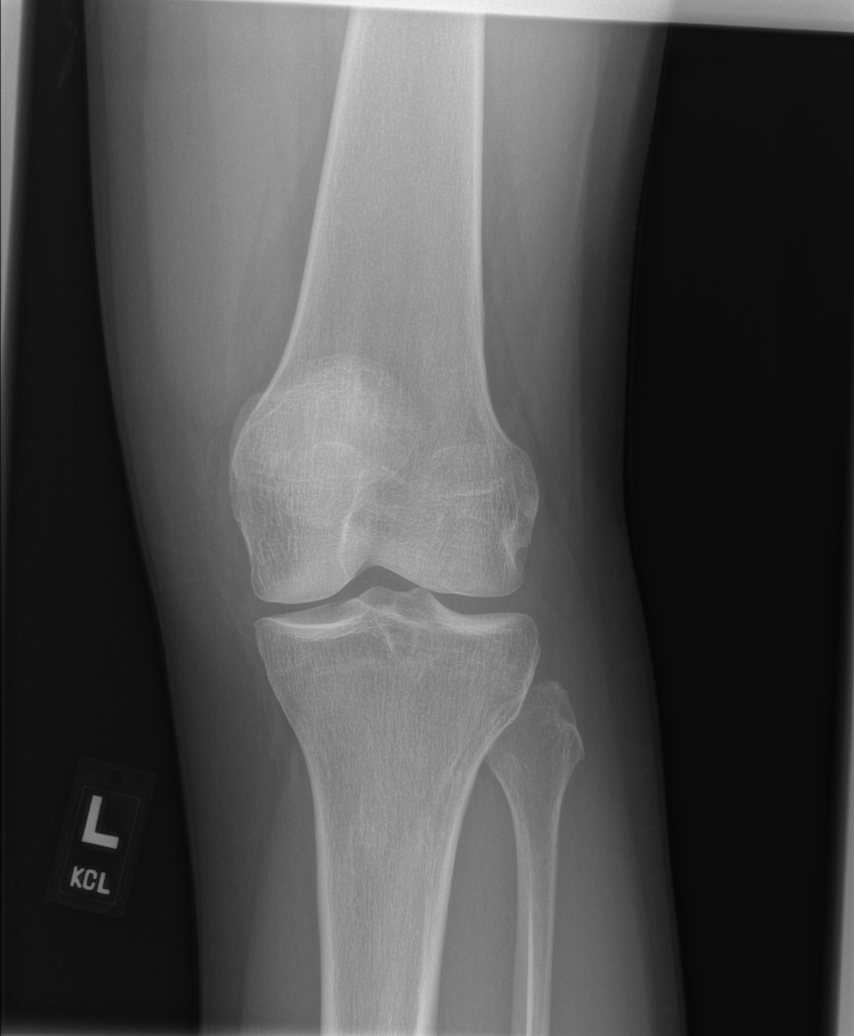

[t knee obl left (1 of 2)]
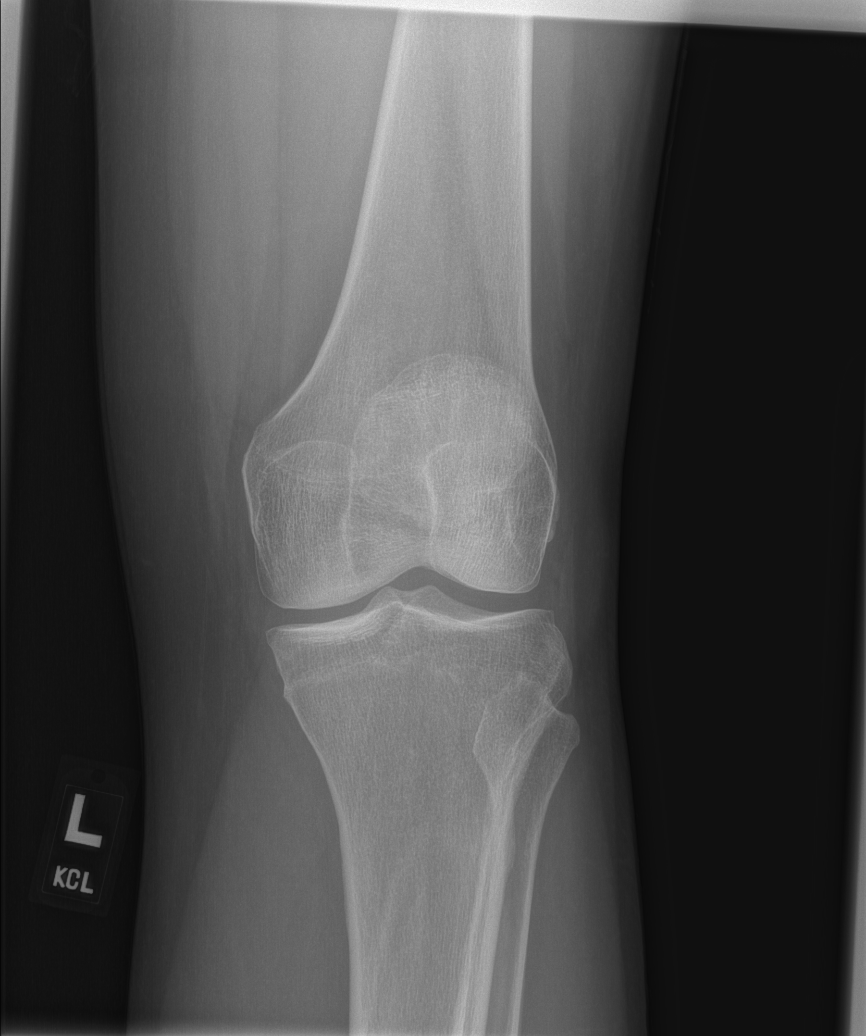

[t knee obl left (2 of 2)]
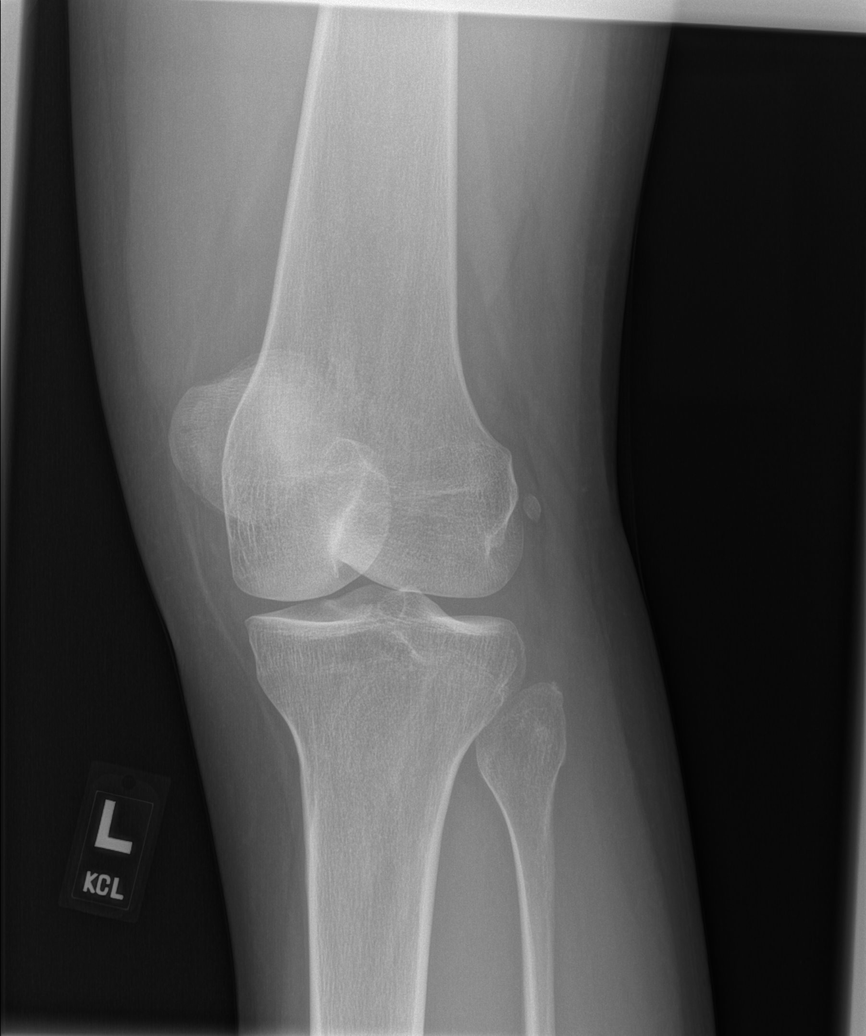

[t knee lat left]
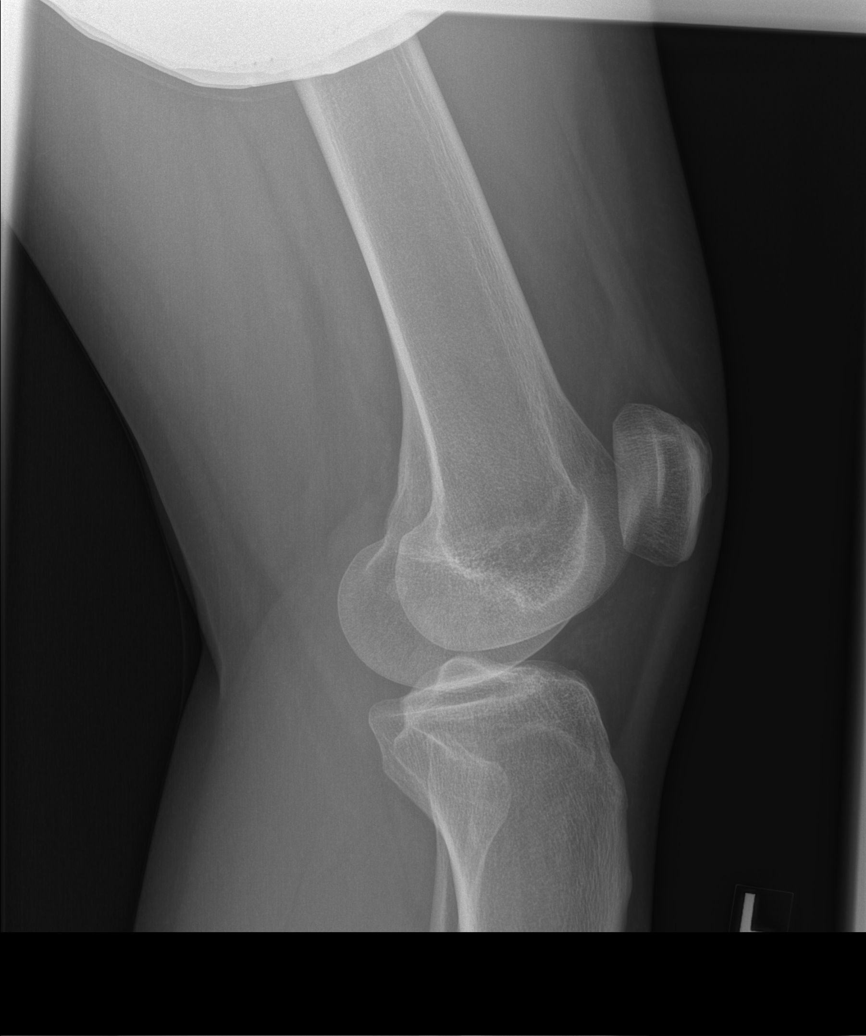

[x knee patella left]
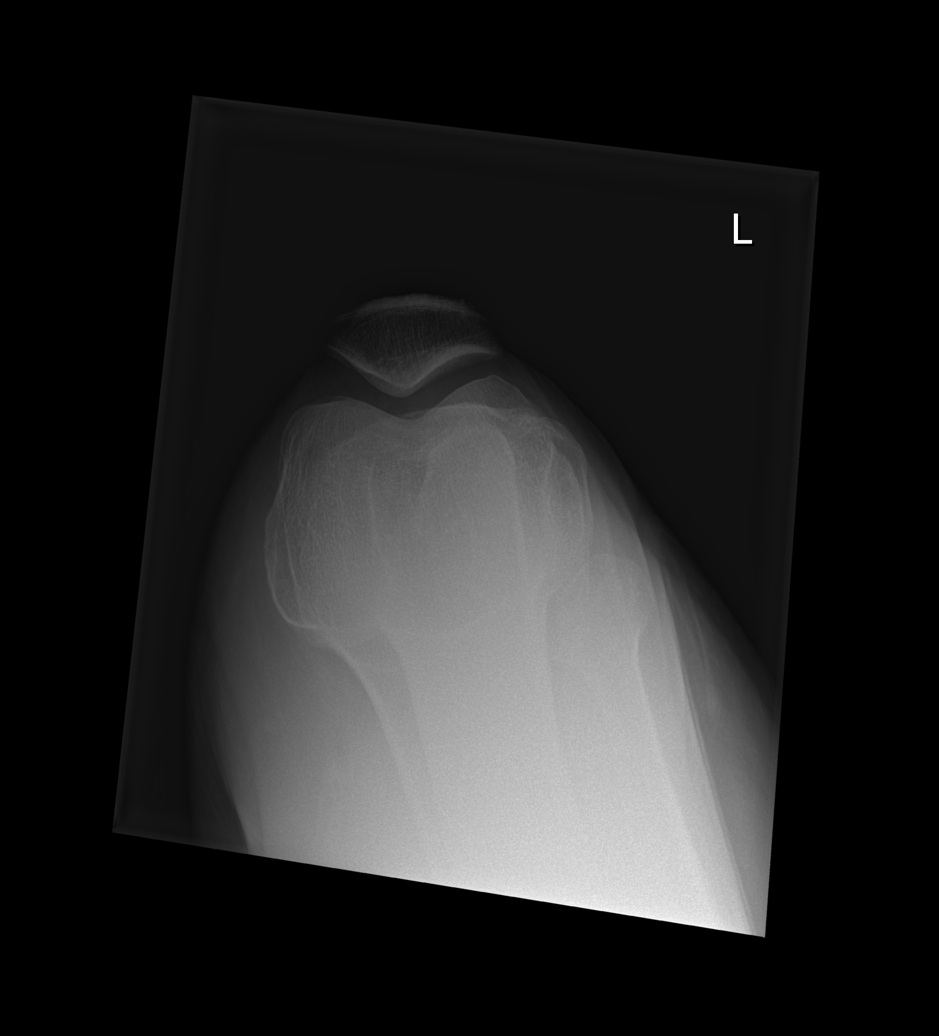

[5 of 5 positions shown; findings below may reference images not displayed]

FINDINGS: No evidence of fracture, dislocation, or joint effusion. No evidence
of arthropathy or other focal bone abnormality. Soft tissues are
unremarkable.
IMPRESSION: Negative.

## 2019-08-17 ENCOUNTER — Ambulatory Visit: Payer: Self-pay | Admitting: Podiatry

## 2022-05-07 NOTE — L&D Delivery Note (Signed)
    [name] is a 37 y.o. [id number] s/p VD at [redacted]w[redacted]d. She was admitted for SROM at 1215.   ROM: 9h 63m with clear fluid GBS Status: Unknown Maximum Maternal Temperature: 98.3  Labor Progress: Patient presented in latent labor and foley bulb placed.  Upon expulsion patient was 4.5cm and rapidly progressed to complete dilation.    Delivery Date/Time: Saturday March 23, 2023 at 2126 Delivery: Patient allowed to emerge in WB tub and after 4 pushes, in squatting position, head delivered with shoulder and body quickly thereafter. Infant immediately brought to surface of water and tactile stimulation given by provider. Infant with minimal grimace, but good tone and heart rate (120).  Mother assisted into sitting position and infant placed on chest with warm towels draped over him.  After 3 minutes, mother assisted out of WB tub into bed.  After 5 minute delay, the umbilical cord was clamped, cut by support person-AJ, and blood collected by provider. Placenta delivered spontaneously via Tomasa Blase and was noted to be intact with 3VC upon inspection.  Vaginal inspection revealed a 2nd degree vaginal laceration that was repaired with 3-0 vicryl on CT-1.  5mL of 1% Lidocaine used for anesthetic and patient tolerated the procedure well. Fundus firm, at the umbilicus, and bleeding small.  Mother hemodynamically stable and infant skin to skin, with SO, prior to provider exit.  Mother desires no method for birth control and opts to breastfeed.    Placenta: Intact, Sent home with patient Complications: None Lacerations: Vaginal-2nd degree. Repaired EBL: 324 Analgesia: Local Lidocaine   Postpartum Planning -Discharge Summary Shared -PP Message Sent  Infant: Female-[name]  APGARs 8,9   6lbs 2.4 oz, 2790g. 19inch  Cherre Robins, CNM  03/24/2023 4:12 AM  Delivery Report: Review the Delivery Report for details.

## 2022-06-19 ENCOUNTER — Emergency Department (HOSPITAL_BASED_OUTPATIENT_CLINIC_OR_DEPARTMENT_OTHER): Payer: Self-pay | Admitting: Radiology

## 2022-06-19 ENCOUNTER — Encounter (HOSPITAL_BASED_OUTPATIENT_CLINIC_OR_DEPARTMENT_OTHER): Payer: Self-pay

## 2022-06-19 ENCOUNTER — Other Ambulatory Visit: Payer: Self-pay

## 2022-06-19 ENCOUNTER — Other Ambulatory Visit (HOSPITAL_BASED_OUTPATIENT_CLINIC_OR_DEPARTMENT_OTHER): Payer: Self-pay

## 2022-06-19 ENCOUNTER — Emergency Department (HOSPITAL_BASED_OUTPATIENT_CLINIC_OR_DEPARTMENT_OTHER)
Admission: EM | Admit: 2022-06-19 | Discharge: 2022-06-19 | Disposition: A | Discharge status: Home / Self Care | Payer: Self-pay | Attending: Emergency Medicine | Admitting: Emergency Medicine

## 2022-06-19 DIAGNOSIS — J101 Influenza due to other identified influenza virus with other respiratory manifestations: Secondary | ICD-10-CM | POA: Insufficient documentation

## 2022-06-19 DIAGNOSIS — Z1152 Encounter for screening for COVID-19: Secondary | ICD-10-CM | POA: Insufficient documentation

## 2022-06-19 LAB — RESP PANEL BY RT-PCR (RSV, FLU A&B, COVID)  RVPGX2
Influenza A by PCR: NEGATIVE
Influenza B by PCR: POSITIVE — AB
Resp Syncytial Virus by PCR: NEGATIVE
SARS Coronavirus 2 by RT PCR: NEGATIVE

## 2022-06-19 LAB — GROUP A STREP BY PCR: Group A Strep by PCR: NOT DETECTED

## 2022-06-19 MED ORDER — IBUPROFEN 400 MG PO TABS
600.0000 mg | ORAL_TABLET | Freq: Once | ORAL | Status: AC
  Administered 2022-06-19: 600 mg via ORAL
  Filled 2022-06-19: qty 1

## 2022-06-19 MED ORDER — OSELTAMIVIR PHOSPHATE 75 MG PO CAPS: 75.0000 mg | ORAL_CAPSULE | Freq: Two times a day (BID) | ORAL | 0 refills | Status: DC

## 2022-06-19 NOTE — ED Provider Notes (Signed)
[facility] Provider Note   CSN: [id number] Arrival date & time: 06/19/22  1159     History  Chief Complaint  Patient presents with   Cough    [name] is a 37 y.o. female.   Cough   37 year old female presents emergency department with complaints of nasal congestion, chills, cough, nasal drainage since Friday of this past week.  Has tried over-the-counter Mucinex, Sudafed which has helped symptoms some.  Denies any known sick contact.  Reports some chest pain with associated cough.  Denies shortness of breath, abdominal pain, nausea, vomiting, urinary/vaginal symptoms, change in bowel habits.  No significant pertinent past medical history. Home Medications Prior to Admission medications   Medication Sig Start Date End Date Taking? Authorizing Provider  ECHINACEA PO Take 1 tablet by mouth daily.    [provider]  ELDERBERRY PO Take 1 tablet by mouth daily.    [provider]  ibuprofen (ADVIL,MOTRIN) 800 MG tablet Take 800 mg by mouth every 8 (eight) hours as needed for fever or headache.    [provider]  oseltamivir (TAMIFLU) 75 MG capsule Take 1 capsule (75 mg total) by mouth every 12 (twelve) hours. 06/19/22   Dion Saucier A, PA  SPIRULINA PO Take 1 tablet by mouth daily.    [provider]  TURMERIC PO Take 1 tablet by mouth daily.    [provider]      Allergies    Sulfa antibiotics and Tomato    Review of Systems   Review of Systems  Respiratory:  Positive for cough.   All other systems reviewed and are negative.   Physical Exam Updated Vital Signs BP 119/81 (BP Location: Right Arm)   Pulse 95   Temp 98.8 F (37.1 C) (Oral)   Resp 18   Ht 5' 11"$  (1.803 m)   Wt 78.5 kg   LMP 06/12/2022 (Approximate)   SpO2 99%   BMI 24.14 kg/m  Physical Exam Vitals and nursing note reviewed.  Constitutional:      General: She is not in acute distress.    Appearance:  She is well-developed.  HENT:     Head: Normocephalic and atraumatic.     Right Ear: Tympanic membrane, ear canal and external ear normal.     Left Ear: Tympanic membrane, ear canal and external ear normal.     Nose: Congestion and rhinorrhea present.     Mouth/Throat:     Comments: Mild posterior pharyngeal erythema.  Uvula midline rise symmetrical phonation.  No sublingual or submandibular swelling appreciated. Eyes:     Conjunctiva/sclera: Conjunctivae normal.  Cardiovascular:     Rate and Rhythm: Normal rate and regular rhythm.     Heart sounds: No murmur heard. Pulmonary:     Effort: Pulmonary effort is normal. No respiratory distress.     Breath sounds: Normal breath sounds. No stridor. No wheezing, rhonchi or rales.  Abdominal:     Palpations: Abdomen is soft.     Tenderness: There is no abdominal tenderness. There is no right CVA tenderness.  Musculoskeletal:        General: No swelling.     Cervical back: Neck supple. No rigidity or tenderness.     Left lower leg: No edema.  Skin:    General: Skin is warm and dry.     Capillary Refill: Capillary refill takes less than 2 seconds.  Neurological:     Mental Status: She is alert.  Psychiatric:        Mood and Affect: Mood normal.     ED Results / Procedures / Treatments   Labs (all labs ordered are listed, but only abnormal results are displayed) Labs Reviewed  RESP PANEL BY RT-PCR (RSV, FLU A&B, COVID)  RVPGX2 - Abnormal; Notable for the following components:      Result Value   Influenza B by PCR POSITIVE (*)    All other components within normal limits  GROUP A STREP BY PCR    EKG None  Radiology DG Chest 2 View  Result Date: 06/19/2022 CLINICAL DATA:  Cough. EXAM: CHEST - 2 VIEW COMPARISON:  07/30/2005 FINDINGS: The lungs are clear without focal pneumonia, edema, pneumothorax or pleural effusion. The cardiopericardial silhouette is within normal limits for size. The visualized bony structures of the thorax  are unremarkable. IMPRESSION: No active cardiopulmonary disease. Electronically Signed   By: Misty Stanley M.D.   On: 06/19/2022 13:24    Procedures Procedures    Medications Ordered in ED Medications  ibuprofen (ADVIL) tablet 600 mg (600 mg Oral Given 06/19/22 1254)    ED Course/ Medical Decision Making/ A&P Clinical Course as of 06/19/22 1531  Tue Jun 19, 2022  1335 Influenza B By PCR(!): POSITIVE [JL]    Clinical Course User Index [JL] Regan Lemming, MD                             Medical Decision Making Amount and/or Complexity of Data Reviewed Labs:  Decision-making details documented in ED Course. Radiology: ordered.  Risk Prescription drug management.   This patient presents to the ED for concern of flulike symptoms, this involves an extensive number of treatment options, and is a complaint that carries with it a high risk of complications and morbidity.  The differential diagnosis includes influenza, COVID, RSV, sepsis, meningitis, pneumonia   Co morbidities that complicate the patient evaluation  See HPI   Additional history obtained:  Additional history obtained from EMR External records from outside source obtained and reviewed including hospital records   Lab Tests:  I Ordered, and personally interpreted labs.  The pertinent results include: Respiratory viral panel positive for influenza B group A strep negative.   Imaging Studies ordered:  I ordered imaging studies including chest x-ray I independently visualized and interpreted imaging which showed no acute cardiopulmonary abnormalities I agree with the radiologist interpretation   Cardiac Monitoring: / EKG:  The patient was maintained on a cardiac monitor.  I personally viewed and interpreted the cardiac monitored which showed an underlying rhythm of: Sinus rhythm   Consultations Obtained:  N/a   Problem List / ED Course / Critical interventions / Medication management  Influenza B I  ordered medication including Advil  Reevaluation of the patient after these medicines showed that the patient improved I have reviewed the patients home medicines and have made adjustments as needed   Social Determinants of Health:  Denies tobacco, illicit drug use.   Test / Admission - Considered:  Influenza B Vitals signs within normal range and stable throughout visit. Laboratory/imaging studies significant for: See above Patient with symptoms most consistent with current diagnosis of influenza B.  Patient overall afebrile in no acute respiratory distress.  Recommend symptomatic therapy at home with Tylenol/Motrin as needed for pain, allergy medicine in the form of Claritin/Zyrtec/Allegra, nasal steroid spray in the form of Flonase/Nasacort.  Recommend follow-up with primary care for reassessment of symptoms  within 3 to 5 days.  Treatment plan discussed at length with patient and she acknowledged understanding was agreeable to said plan. Worrisome signs and symptoms were discussed with the patient, and the patient acknowledged understanding to return to the ED if noticed. Patient was stable upon discharge.          Final Clinical Impression(s) / ED Diagnoses Final diagnoses:  Influenza B    Rx / DC Orders ED Discharge Orders          Ordered    oseltamivir (TAMIFLU) 75 MG capsule  Every 12 hours,   Status:  Discontinued        06/19/22 1302    oseltamivir (TAMIFLU) 75 MG capsule  Every 12 hours        06/19/22 1346              Wilnette Kales, [place] 06/19/22 1531    Regan Lemming, MD 06/19/22 [phone]

## 2022-06-19 NOTE — ED Notes (Signed)
RN provided AVS using Teachback Method. Patient verbalizes understanding of Discharge Instructions. Opportunity for Questioning and Answers were provided by RN. Patient Discharged from ED ambulatory to Home via Self.  

## 2022-06-19 NOTE — Discharge Instructions (Addendum)
Note that you tested positive for influenza B today.  Chest x-ray was without evidence of pneumonia or other abnormality.  Recommend symptomatic therapy at home with Tylenol/Motrin as needed for pain, fever.  Recommend allergy medicine in the form of Claritin/Allegra/Zyrtec as well as nasal steroid spray in the form of Flonase/Nasacort.  I will send in prescription for antiviral for influenza in the form of Tamiflu.  Recommend reevaluation by primary care provider in 3 to 5 days.  Please do not hesitate to return to emergency department if the worrisome signs and symptoms we discussed become apparent.

## 2022-06-19 NOTE — ED Triage Notes (Signed)
Patient here POV from Home.  Endorses nasal Drainage, Congestion, Chills, Aches that began Friday and have remained Consistent since. Also a Dry Cough and Headache.   No Fever. No N/V  NAD Noted during Triage. A&Ox4. GCS 15. Ambulatory.

## 2022-08-20 ENCOUNTER — Emergency Department (HOSPITAL_BASED_OUTPATIENT_CLINIC_OR_DEPARTMENT_OTHER)
Admission: EM | Admit: 2022-08-20 | Discharge: 2022-08-20 | Disposition: A | Discharge status: Home / Self Care | Payer: Self-pay | Attending: Emergency Medicine | Admitting: Emergency Medicine

## 2022-08-20 ENCOUNTER — Other Ambulatory Visit: Payer: Self-pay

## 2022-08-20 ENCOUNTER — Emergency Department (HOSPITAL_BASED_OUTPATIENT_CLINIC_OR_DEPARTMENT_OTHER): Payer: Self-pay

## 2022-08-20 DIAGNOSIS — D72829 Elevated white blood cell count, unspecified: Secondary | ICD-10-CM | POA: Insufficient documentation

## 2022-08-20 DIAGNOSIS — O2390 Unspecified genitourinary tract infection in pregnancy, unspecified trimester: Secondary | ICD-10-CM | POA: Insufficient documentation

## 2022-08-20 DIAGNOSIS — R8271 Bacteriuria: Secondary | ICD-10-CM

## 2022-08-20 DIAGNOSIS — Z3A01 Less than 8 weeks gestation of pregnancy: Secondary | ICD-10-CM | POA: Diagnosis not present

## 2022-08-20 DIAGNOSIS — O4690 Antepartum hemorrhage, unspecified, unspecified trimester: Secondary | ICD-10-CM | POA: Diagnosis present

## 2022-08-20 DIAGNOSIS — Z3491 Encounter for supervision of normal pregnancy, unspecified, first trimester: Secondary | ICD-10-CM

## 2022-08-20 DIAGNOSIS — N83201 Unspecified ovarian cyst, right side: Secondary | ICD-10-CM

## 2022-08-20 DIAGNOSIS — O208 Other hemorrhage in early pregnancy: Secondary | ICD-10-CM

## 2022-08-20 LAB — URINALYSIS, ROUTINE W REFLEX MICROSCOPIC

## 2022-08-20 LAB — URINALYSIS, MICROSCOPIC (REFLEX): RBC / HPF: >50 RBC/hpf (ref 0–5)

## 2022-08-20 LAB — CBC
HCT: 36.7 % (ref 36.0–46.0)
Hemoglobin: 11.9 g/dL — ABNORMAL LOW (ref 12.0–15.0)
MCH: 28.3 pg (ref 26.0–34.0)
MCHC: 32.4 g/dL (ref 30.0–36.0)
MCV: 87.4 fL (ref 80.0–100.0)
Platelets: 361 10*3/uL (ref 150–400)
RBC: 4.2 MIL/uL (ref 3.87–5.11)
RDW: 47.8 % — ABNORMAL HIGH (ref 11.5–15.5)
WBC: 11.5 10*3/uL — ABNORMAL HIGH (ref 4.0–10.5)
nRBC: 0 % (ref 0.0–0.2)

## 2022-08-20 LAB — PREGNANCY, URINE: Preg Test, Ur: POSITIVE — AB

## 2022-08-20 LAB — BASIC METABOLIC PANEL
Anion gap: 11 (ref 5–15)
BUN: 9 mg/dL (ref 6–20)
CO2: 20 mmol/L — ABNORMAL LOW (ref 22–32)
Calcium: 10.2 mg/dL (ref 8.9–10.3)
Chloride: 104 mmol/L (ref 98–111)
Creatinine, Ser: 0.79 mg/dL (ref 0.44–1.00)
GFR, Estimated: >60 mL/min (ref >60)
Glucose, Bld: 92 mg/dL (ref 70–99)
Potassium: 4.1 mmol/L (ref 3.5–5.1)
Sodium: 135 mmol/L (ref 135–145)

## 2022-08-20 LAB — HCG, QUANTITATIVE, PREGNANCY: hCG, Beta Chain, Quant, S: 63250 m[IU]/mL — ABNORMAL HIGH (ref <5)

## 2022-08-20 MED ORDER — CEPHALEXIN 500 MG PO CAPS: 500.0000 mg | ORAL_CAPSULE | Freq: Two times a day (BID) | ORAL | 0 refills | Status: AC

## 2022-08-20 NOTE — ED Notes (Signed)
Discharge instructions discussed with pt. Pt verbalized understanding with no additional questions at this time.

## 2022-08-20 NOTE — ED Notes (Signed)
Patient transported to Ultrasound via rad staff.

## 2022-08-20 NOTE — ED Provider Notes (Signed)
EMERGENCY DEPARTMENT AT [facility] Provider Note   CSN: [id number] Arrival date & time: 08/20/22  1849     History  Chief Complaint  Patient presents with   Abdominal Cramping   Vaginal Bleeding    pregnant    [name] is a 37 y.o. female with no reported past medical history who presents to the ER complaining of abdominal cramping and vaginal bleeding. Patient believes she is about [redacted] weeks pregnant based off home testing. Has not yet established with OBGYN. Complaining of cramping and vaginal bleeding starting today. States it was more than just spotting but no gushing of blood. Reports history of ovarian cysts.    Abdominal Cramping Associated symptoms include abdominal pain.  Vaginal Bleeding Associated symptoms: abdominal pain        Home Medications Prior to Admission medications   Medication Sig Start Date End Date Taking? Authorizing Provider  cephALEXin (KEFLEX) 500 MG capsule Take 1 capsule (500 mg total) by mouth 2 (two) times daily for 7 days. 08/20/22 08/27/22 Yes Breanna Shorkey T, PA-C  ECHINACEA PO Take 1 tablet by mouth daily.    [provider]  ELDERBERRY PO Take 1 tablet by mouth daily.    [provider]  ibuprofen (ADVIL,MOTRIN) 800 MG tablet Take 800 mg by mouth every 8 (eight) hours as needed for fever or headache.    [provider]  oseltamivir (TAMIFLU) 75 MG capsule Take 1 capsule (75 mg total) by mouth every 12 (twelve) hours. 06/19/22   Sherian Maroon A, PA  SPIRULINA PO Take 1 tablet by mouth daily.    [provider]  TURMERIC PO Take 1 tablet by mouth daily.    [provider]      Allergies    Sulfa antibiotics and Tomato    Review of Systems   Review of Systems  Gastrointestinal:  Positive for abdominal pain.  Genitourinary:  Positive for vaginal bleeding.  All other systems reviewed and are negative.   Physical Exam Updated Vital Signs BP 114/78   Pulse 65    Temp (!) 97.5 F (36.4 C) (Oral)   Resp 18   Ht  (1.803 m)   Wt 85.7 kg   SpO2 100%   BMI 26.36 kg/m  Physical Exam Vitals and nursing note reviewed.  Constitutional:      Appearance: Normal appearance.  HENT:     Head: Normocephalic and atraumatic.  Eyes:     Conjunctiva/sclera: Conjunctivae normal.  Cardiovascular:     Rate and Rhythm: Normal rate and regular rhythm.  Pulmonary:     Effort: Pulmonary effort is normal. No respiratory distress.     Breath sounds: Normal breath sounds.  Abdominal:     General: There is no distension.     Palpations: Abdomen is soft.     Tenderness: There is no abdominal tenderness.  Genitourinary:    Comments: GU exam deferred Skin:    General: Skin is warm and dry.  Neurological:     General: No focal deficit present.     Mental Status: She is alert.     ED Results / Procedures / Treatments   Labs (all labs ordered are listed, but only abnormal results are displayed) Labs Reviewed  PREGNANCY, URINE - Abnormal; Notable for the following components:      Result Value   Preg Test, Ur POSITIVE (*)    All other components within normal limits  CBC - Abnormal; Notable for the following  components:   WBC 11.5 (*)    Hemoglobin 11.9 (*)    RDW 47.8 (*)    All other components within normal limits  BASIC METABOLIC PANEL - Abnormal; Notable for the following components:   CO2 20 (*)    All other components within normal limits  HCG, QUANTITATIVE, PREGNANCY - Abnormal; Notable for the following components:   hCG, Beta Chain, Quant, S 63,250 (*)    All other components within normal limits  URINALYSIS, ROUTINE W REFLEX MICROSCOPIC - Abnormal; Notable for the following components:   Color, Urine RED (*)    APPearance CLOUDY (*)    Glucose, UA   (*)    Value: TEST NOT REPORTED DUE TO COLOR INTERFERENCE OF URINE PIGMENT   Hgb urine dipstick   (*)    Value: TEST NOT REPORTED DUE TO COLOR INTERFERENCE OF URINE PIGMENT   Bilirubin  Urine   (*)    Value: TEST NOT REPORTED DUE TO COLOR INTERFERENCE OF URINE PIGMENT   Ketones, ur   (*)    Value: TEST NOT REPORTED DUE TO COLOR INTERFERENCE OF URINE PIGMENT   Protein, ur   (*)    Value: TEST NOT REPORTED DUE TO COLOR INTERFERENCE OF URINE PIGMENT   Nitrite   (*)    Value: TEST NOT REPORTED DUE TO COLOR INTERFERENCE OF URINE PIGMENT   Leukocytes,Ua   (*)    Value: TEST NOT REPORTED DUE TO COLOR INTERFERENCE OF URINE PIGMENT   All other components within normal limits  URINALYSIS, MICROSCOPIC (REFLEX) - Abnormal; Notable for the following components:   Bacteria, UA MANY (*)    All other components within normal limits    EKG None  Radiology US OB LESS THAN 14 WEEKS WITH OB TRANSVAGINAL  Result Date: 08/20/2022 CLINICAL DATA:  Pregnant, vaginal bleeding, LMP 07/04/2022, assign gestational age [redacted] weeks, 5 days. EXAM: OBSTETRIC <14 WK [place] AND TRANSVAGINAL OB US TECHNIQUE: Both transabdominal and transvaginal ultrasound examinations were performed for complete evaluation of the gestation as well as the maternal uterus, adnexal regions, and pelvic cul-de-sac. Transvaginal technique was performed to assess early pregnancy. COMPARISON:  None Available. FINDINGS: Intrauterine gestational sac: Present, single Yolk sac:  Visualized. Embryo:  Visualized. Cardiac Activity: Visualized. Heart Rate: 124 bpm CRL:  6 mm   6 w   2 d                  [place] EDC: 04/13/2023 Subchorionic hemorrhage: A small subchorionic hemorrhage is identified Maternal uterus/adnexae: The cervix is closed and is unremarkable. A heterogeneously hypoechoic solid subserosal mass is seen within the left uterine fundus measuring 2.8 x 3.4 x 3.9 cm in keeping with a subserosal uterine fibroid. No other intrauterine masses are seen. The maternal ovaries are normal in size and echogenicity and demonstrate normal color flow vascularity. A complex cystic lesion demonstrating a fluid fluid level within the right ovary likely  represents a hemorrhagic cyst or hemorrhagic corpus luteum. Within the left ovary, 2 dominant follicles are identified. No adnexal masses are seen. Small free fluid noted within the cul-de-sac. IMPRESSION: Single living intrauterine gestation with an estimated gestational age of [redacted] weeks, 2 days. Small subchorionic hemorrhage. Probable small hemorrhagic cyst or hemorrhagic corpus luteum within the right ovary. Electronically Signed   By: Helyn Numbers M.D.   On: 08/20/2022 22:16    Procedures Procedures    Medications Ordered in ED Medications - No data to display  ED Course/ Medical Decision Making/ A&P  Medical Decision Making Amount and/or Complexity of Data Reviewed Labs: ordered. Radiology: ordered.  Risk Prescription drug management.   This patient is a 37 y.o. female  who presents to the ED for concern of abdominal cramping and vaginal bleeding starting today.   Differential diagnoses prior to evaluation: The emergent differential diagnosis includes, but is not limited to,  Abnormal uterine bleeding, threatened miscarriage, incomplete miscarriage, normal bleeding from an early trimester pregnancy, ectopic pregnancy, vaginal/cervical trauma, subchorionic hemorrhage/hematoma . This is not an exhaustive differential.   Past Medical History / Co-morbidities: No documented medical history  Physical Exam: Physical exam performed. The pertinent findings include: Normal vital signs, in no acute distress. Abdomen soft and nontender. GU exam deferred.   Lab Tests/Imaging studies: I personally interpreted labs/imaging and the pertinent results include: Mild leukocytosis of 11.5, hemoglobin stable.  BMP unremarkable, normal kidney function.  Urinalysis with many bacteria and 21-50 WBCs, otherwise urinalysis unable to interpret due to contamination with blood.  Urine pregnancy positive, and serum hCG quantitative 63,250.  Transvaginal ultrasound shows single  intrauterine pregnancy with estimated gestational age of [redacted] weeks 2 days, with heartbeat measured at 124 bpm.  Evidence of small subchorionic hemorrhage, and probable small hemorrhagic cyst within the right ovary.  Disposition: After consideration of the diagnostic results and the patients response to treatment, I feel that emergency department workup does not suggest an emergent condition requiring admission or immediate intervention beyond what has been performed at this time. The plan is: Discharged home with recommendation to establish with OB/GYN, resources provided.  Patient already taking prenatal supplements.  Will give antibiotics for asymptomatic bacteria in pregnancy.  Explained to patient that her subchorionic hemorrhage does put her at a greater risk for miscarriage.  Otherwise workup today unremarkable.  The patient is safe for discharge and has been instructed to return immediately for worsening symptoms, change in symptoms or any other concerns.  Final Clinical Impression(s) / ED Diagnoses Final diagnoses:  [redacted] weeks gestation of pregnancy  First trimester pregnancy  Subchorionic hemorrhage of placenta in first trimester  Hemorrhagic cyst of right ovary  Asymptomatic bacteriuria during pregnancy in first trimester    Rx / DC Orders ED Discharge Orders          Ordered    cephALEXin (KEFLEX) 500 MG capsule  2 times daily        08/20/22 2240           Portions of this report may have been transcribed using voice recognition software. Every effort was made to ensure accuracy; however, inadvertent computerized transcription errors may be present.    Su Monks, PA-C 08/20/22 2300    Tanda Rockers A, DO 08/23/22 1540

## 2022-08-20 NOTE — ED Triage Notes (Signed)
Patient arrives with complaints of new onset vaginal bleeding and abdominal cramping today. Patient states that she is ~[redacted] weeks pregnant.

## 2022-08-20 NOTE — Discharge Instructions (Addendum)
You were seen in the ER for abdominal cramping and vaginal bleeding.  Your ultrasound showed an intrauterine pregnancy  at 6 weeks 2 days gestation. There's evidence of a small subchorionic hemorrhage, which is the most common cause of bleeding in the first trimester of pregnancy.   I have prescribed antibiotics for the bacteria in your urine.  Please follow up with the OBGYN. I've attached contact information for [facility] as well as a list of other offices in the area.   [facility] [employer] for [employer] at [employer] [facility]             [address] [place], [place] [address] [phone]  Center for [facility] at [facility]                                                             [address], [address], [place], [place], [address] [phone]  [facility] at [facility] [address], [place], [place], [address] [phone]  Center for [facility] at [facility] [address], [address], [place], [place], [address] [phone]  Center for [facility] at [facility]                                 [address] [place], [place], [place], [address] [phone]  Center for [facility] at [facility]                                    [address] [place], [place], [address] [phone]  Center for [facility] at [facility] [address], [place], [place], [address]                              [facility] [address], [place], [place], [address] [phone]  [facility]         Phone: [phone]  [facility]      Phone: [phone]   [facility]      Phone: [phone]  [facility]-Family Planning         Phone: [phone]   [facility]  Department-Maternity    Phone: [phone]  [facility]      Phone: [phone]  [facility]     Phone: [phone]  [facility]        Phone: [phone]  [facility]      Phone: [phone]

## 2022-11-20 ENCOUNTER — Other Ambulatory Visit: Payer: Self-pay

## 2022-11-20 ENCOUNTER — Encounter (HOSPITAL_COMMUNITY): Payer: Self-pay

## 2022-11-20 ENCOUNTER — Inpatient Hospital Stay (HOSPITAL_COMMUNITY)
Admission: AD | Admit: 2022-11-20 | Discharge: 2022-11-21 | Disposition: A | Discharge status: Home / Self Care | Payer: Medicaid Other | Attending: Obstetrics and Gynecology | Admitting: Obstetrics and Gynecology

## 2022-11-20 DIAGNOSIS — O26892 Other specified pregnancy related conditions, second trimester: Secondary | ICD-10-CM | POA: Insufficient documentation

## 2022-11-20 DIAGNOSIS — D649 Anemia, unspecified: Secondary | ICD-10-CM

## 2022-11-20 DIAGNOSIS — R1012 Left upper quadrant pain: Secondary | ICD-10-CM

## 2022-11-20 DIAGNOSIS — R109 Unspecified abdominal pain: Secondary | ICD-10-CM | POA: Diagnosis present

## 2022-11-20 DIAGNOSIS — Z3A19 19 weeks gestation of pregnancy: Secondary | ICD-10-CM | POA: Insufficient documentation

## 2022-11-20 DIAGNOSIS — B9689 Other specified bacterial agents as the cause of diseases classified elsewhere: Secondary | ICD-10-CM

## 2022-11-20 DIAGNOSIS — N760 Acute vaginitis: Secondary | ICD-10-CM | POA: Diagnosis not present

## 2022-11-20 LAB — URINALYSIS, ROUTINE W REFLEX MICROSCOPIC
Bilirubin Urine: NEGATIVE
Glucose, UA: NEGATIVE mg/dL
Hgb urine dipstick: NEGATIVE
Ketones, ur: NEGATIVE mg/dL
Nitrite: NEGATIVE
Protein, ur: NEGATIVE mg/dL
Specific Gravity, Urine: 1.009 (ref 1.005–1.030)
pH: 5 (ref 5.0–8.0)

## 2022-11-20 MED ORDER — ALUM & MAG HYDROXIDE-SIMETH 200-200-20 MG/5ML PO SUSP
30.0000 mL | Freq: Once | ORAL | Status: AC
  Administered 2022-11-20: 30 mL via ORAL
  Filled 2022-11-20: qty 30

## 2022-11-20 MED ORDER — LIDOCAINE VISCOUS HCL 2 % MT SOLN
15.0000 mL | Freq: Once | OROMUCOSAL | Status: AC
  Administered 2022-11-20: 15 mL via ORAL
  Filled 2022-11-20: qty 15

## 2022-11-20 MED ORDER — CYCLOBENZAPRINE HCL 5 MG PO TABS
10.0000 mg | ORAL_TABLET | Freq: Once | ORAL | Status: AC
  Administered 2022-11-20: 10 mg via ORAL
  Filled 2022-11-20: qty 2

## 2022-11-20 MED ORDER — ACETAMINOPHEN 500 MG PO TABS
1000.0000 mg | ORAL_TABLET | Freq: Once | ORAL | Status: AC
  Administered 2022-11-20: 1000 mg via ORAL
  Filled 2022-11-20: qty 2

## 2022-11-20 NOTE — MAU Note (Signed)
.  [name] is a 37 y.o. at [redacted]w[redacted]d here in MAU reporting: last night started having pain in the middle of abdomen - every now it would be a shooting pain but now it is constant. Denies VB, LOF, or abnormal discharge. States she feels like a migraine is starting to come on.   Onset of complaint: 1730 Pain score: 8 - abdomen; 4 - HA Vitals:   11/20/22 1950  BP: 113/71  Pulse: 79  Resp: 18  Temp: 98 F (36.7 C)  SpO2: 98%     FHT:150 Lab orders placed from triage:  UA

## 2022-11-20 NOTE — MAU Provider Note (Signed)
  History     CSN: [id number]  Arrival date and time: 11/20/22 1909   Event Date/Time   First Provider Initiated Contact with Patient 11/20/22 2253      Chief Complaint  Patient presents with  . Abdominal Pain   HPI  {GYN/OB EX:[id number]}  Past Medical History:  Diagnosis Date  . Medical history non-contributory     Past Surgical History:  Procedure Laterality Date  . DILATION AND CURETTAGE OF UTERUS      Family History  Problem Relation Age of Onset  . Asthma Mother   . Cancer Father   . Asthma Sister   . Asthma Brother   . Diabetes Maternal Aunt   . Diabetes Maternal Uncle   . Diabetes Maternal Grandmother   . Diabetes Maternal Grandfather   . Hypertension Paternal Grandmother   . Hypertension Paternal Grandfather     Social History   Tobacco Use  . Smoking status: Never  . Smokeless tobacco: Never  Vaping Use  . Vaping status: Never Used  Substance Use Topics  . Alcohol use: Yes    Comment: Socially  . Drug use: No    Allergies:  Allergies  Allergen Reactions  . Sulfa Antibiotics Hives and Swelling  . Tomato Other (See Comments)    "Lips and throat itch and swell"    Medications Prior to Admission  Medication Sig Dispense Refill Last Dose  . ECHINACEA PO Take 1 tablet by mouth daily.     Marland Kitchen ELDERBERRY PO Take 1 tablet by mouth daily.     Marland Kitchen ibuprofen (ADVIL,MOTRIN) 800 MG tablet Take 800 mg by mouth every 8 (eight) hours as needed for fever or headache.     . oseltamivir (TAMIFLU) 75 MG capsule Take 1 capsule (75 mg total) by mouth every 12 (twelve) hours. 10 capsule 0   . SPIRULINA PO Take 1 tablet by mouth daily.     . TURMERIC PO Take 1 tablet by mouth daily.       Review of Systems Physical Exam   Blood pressure 113/71, pulse 79, temperature 98 F (36.7 C), temperature source Oral, resp. rate 18, height 5\' 11"  (1.803 m), weight 84.5 kg, SpO2 98%.  Physical Exam  MAU Course  Procedures  MDM ***  Assessment and Plan  ***  Claudette Head 11/20/2022, 10:54 PM

## 2022-11-21 DIAGNOSIS — Z3A19 19 weeks gestation of pregnancy: Secondary | ICD-10-CM

## 2022-11-21 DIAGNOSIS — D649 Anemia, unspecified: Secondary | ICD-10-CM

## 2022-11-21 DIAGNOSIS — N760 Acute vaginitis: Secondary | ICD-10-CM | POA: Diagnosis not present

## 2022-11-21 DIAGNOSIS — R1012 Left upper quadrant pain: Secondary | ICD-10-CM

## 2022-11-21 DIAGNOSIS — B9689 Other specified bacterial agents as the cause of diseases classified elsewhere: Secondary | ICD-10-CM

## 2022-11-21 LAB — CBC WITH DIFFERENTIAL/PLATELET
Abs Immature Granulocytes: 0.02 10*3/uL (ref 0.00–0.07)
Basophils Absolute: 0 10*3/uL (ref 0.0–0.1)
Basophils Relative: 0 %
Eosinophils Absolute: 0.1 10*3/uL (ref 0.0–0.5)
Eosinophils Relative: 2 %
HCT: 30.6 % — ABNORMAL LOW (ref 36.0–46.0)
Hemoglobin: 9.7 g/dL — ABNORMAL LOW (ref 12.0–15.0)
Immature Granulocytes: 0 %
Lymphocytes Relative: 25 %
Lymphs Abs: 1.2 10*3/uL (ref 0.7–4.0)
MCH: 23.6 pg — ABNORMAL LOW (ref 26.0–34.0)
MCHC: 31.7 g/dL (ref 30.0–36.0)
MCV: 74.5 fL — ABNORMAL LOW (ref 80.0–100.0)
Monocytes Absolute: 0.5 10*3/uL (ref 0.1–1.0)
Monocytes Relative: 10 %
Neutro Abs: 3.1 10*3/uL (ref 1.7–7.7)
Neutrophils Relative %: 63 %
Platelets: 145 10*3/uL — ABNORMAL LOW (ref 150–400)
RBC: 4.11 MIL/uL (ref 3.87–5.11)
RDW: 15.9 % — ABNORMAL HIGH (ref 11.5–15.5)
WBC: 5 10*3/uL (ref 4.0–10.5)
nRBC: 0 % (ref 0.0–0.2)

## 2022-11-21 LAB — GC/CHLAMYDIA PROBE AMP (~~LOC~~) NOT AT ARMC
Chlamydia: NEGATIVE
Comment: NEGATIVE
Comment: NORMAL
Neisseria Gonorrhea: NEGATIVE

## 2022-11-21 LAB — WET PREP, GENITAL
Sperm: NONE SEEN
Trich, Wet Prep: NONE SEEN
WBC, Wet Prep HPF POC: >=10 — AB (ref <10)
Yeast Wet Prep HPF POC: NONE SEEN

## 2022-11-21 MED ORDER — METRONIDAZOLE 500 MG PO TABS: 500.0000 mg | ORAL_TABLET | Freq: Two times a day (BID) | ORAL | 0 refills | Status: DC

## 2022-11-21 MED ORDER — FERROUS SULFATE 325 (65 FE) MG PO TABS: 325.0000 mg | ORAL_TABLET | Freq: Every day | ORAL | 0 refills | Status: DC

## 2022-11-22 ENCOUNTER — Telehealth: Payer: Medicaid Other

## 2022-11-22 DIAGNOSIS — O0990 Supervision of high risk pregnancy, unspecified, unspecified trimester: Secondary | ICD-10-CM | POA: Insufficient documentation

## 2022-11-22 DIAGNOSIS — O09529 Supervision of elderly multigravida, unspecified trimester: Secondary | ICD-10-CM

## 2022-11-22 LAB — CULTURE, OB URINE: Culture: >=100000 — AB

## 2022-11-22 NOTE — Progress Notes (Signed)
Called pt at 1022; VM left. Called pt again at approx 1030. Pt is driving at this time. Explained I am unable to complete visit due to safety concerns. New OB visit is next Friday, no available intake appts prior to. Due to patient being [redacted]w[redacted]d will attempt to complete intake so that new OB appt does not have to be moved. Explained to patient I will call her later in the day to review OB intake questions if time allows. Anatomy US scheduled.   Marjo Bicker, RN 11/22/2022  10:57 AM  Addend: Unable to call patient. Called pt 11/23/22; no answer. Will follow up next week. If unable to reach patient she will need to reschedule new OB visit.  Fleet Contras RN 11/23/22

## 2022-11-29 ENCOUNTER — Telehealth (INDEPENDENT_AMBULATORY_CARE_PROVIDER_SITE_OTHER): Payer: Medicaid Other

## 2022-11-29 DIAGNOSIS — Z3689 Encounter for other specified antenatal screening: Secondary | ICD-10-CM

## 2022-11-29 DIAGNOSIS — O0990 Supervision of high risk pregnancy, unspecified, unspecified trimester: Secondary | ICD-10-CM

## 2022-11-29 MED ORDER — BLOOD PRESSURE KIT DEVI
1.0000 | 0 refills | Status: DC | PRN
Start: 2022-11-29 — End: 2023-01-25

## 2022-11-29 NOTE — Progress Notes (Signed)
New OB Intake  I connected with [name]  on 11/29/22 at  1:15 PM EDT by MyChart Video Visit and verified that I am speaking with the correct person using two identifiers. Nurse is located at [facility] and pt is located at home.  I discussed the limitations, risks, security and privacy concerns of performing an evaluation and management service by telephone and the availability of in person appointments. I also discussed with the patient that there may be a patient responsible charge related to this service. The patient expressed understanding and agreed to proceed.  I explained I am completing New OB Intake today. We discussed EDD of 04/13/2023, by Ultrasound. Pt is [id number]. I reviewed her allergies, medications and Medical/Surgical/OB history.    Patient Active Problem List   Diagnosis Date Noted   Supervision of high risk pregnancy, antepartum 11/22/2022   AMA (advanced maternal age) multigravida 35+ 11/22/2022    Concerns addressed today  Delivery Plans Plans to deliver at [facility]. Discussed the nature of our practice with multiple providers including residents and students. Due to the size of the practice, the delivering provider may not be the same as those providing prenatal care.    MyChart/Babyscripts MyChart access verified. I explained pt will have some visits in office and some virtually. Babyscripts instructions given and order placed. Patient verifies receipt of registration text/e-mail. Account successfully created and app downloaded.  Blood Pressure Cuff/Weight Scale Blood pressure cuff ordered for patient to pick-up from [employer]. Explained after first prenatal appt pt will check weekly and document in Babyscripts. Anatomy US Explained first scheduled [place] will be around 19 weeks. Anatomy US scheduled for 12/17/22 at 7:45am.  Is patient a CenteringPregnancy candidate?  Declined Declined due to Declined to say   Is patient a Mom+Baby Combined Care candidate?   Not a candidate   If accepted, confirm patient does not intend to move from the area for at least 12 months, then notify Mom+Baby staff  Interested in [place]? If yes, send referral and doula dot phrase.    First visit review I reviewed new OB appt with patient. Explained pt will be seen by Sandra Cockayne, CNM at first visit. Discussed Avelina Laine genetic screening with patient.  Panorama and Horizon.. Routine prenatal labs is needed at new ob appointment.   Last Pap No results found for: "DIAGPAP"  Lowry Bowl, CMA 11/29/2022  1:47 PM

## 2022-11-29 NOTE — Patient Instructions (Signed)
Safe Medications in Pregnancy   Acne:  Benzoyl Peroxide  Salicylic Acid   Backache/Headache:  Tylenol: 2 regular strength every 4 hours OR               2 Extra strength every 6 hours   Colds/Coughs/Allergies:  Benadryl (alcohol free) 25 mg every 6 hours as needed  Breath right strips  Claritin  Cepacol throat lozenges  Chloraseptic throat spray  Cold-Eeze- up to three times per day  Cough drops, alcohol free  Flonase (by prescription only)  Guaifenesin  Mucinex  Robitussin DM (plain only, alcohol free)  Saline nasal spray/drops  Sudafed (pseudoephedrine) & Actifed * use only after [redacted] weeks gestation and if you do not have high blood pressure  Tylenol  Vicks Vaporub  Zinc lozenges  Zyrtec   Constipation:  Colace  Ducolax suppositories  Fleet enema  Glycerin suppositories  Metamucil  Milk of magnesia  Miralax  Senokot  Smooth move tea   Diarrhea:  Kaopectate  Imodium A-D   *NO pepto Bismol   Hemorrhoids:  Anusol  Anusol HC  Preparation H  Tucks   Indigestion:  Tums  Maalox  Mylanta  Zantac  Pepcid   Insomnia:  Benadryl (alcohol free) 25mg every 6 hours as needed  Tylenol PM  Unisom, no Gelcaps   Leg Cramps:  Tums  MagGel   Nausea/Vomiting:  Bonine  Dramamine  Emetrol  Ginger extract  Sea bands  Meclizine  Nausea medication to take during pregnancy:  Unisom (doxylamine succinate 25 mg tablets) Take one tablet daily at bedtime. If symptoms are not adequately controlled, the dose can be increased to a maximum recommended dose of two tablets daily (1/2 tablet in the morning, 1/2 tablet mid-afternoon and one at bedtime).  Vitamin B6 100mg tablets. Take one tablet twice a day (up to 200 mg per day).   Skin Rashes:  Aveeno products  Benadryl cream or 25mg every 6 hours as needed  Calamine Lotion  1% cortisone cream   Yeast infection:  Gyne-lotrimin 7  Monistat 7    **If taking multiple medications, please check labels to avoid  duplicating the same active ingredients  **take medication as directed on the label  ** Do not exceed 4000 mg of tylenol in 24 hours  **Do not take medications that contain aspirin or ibuprofen             [place] Pediatric Providers  [place]outheast [place] ([address]) [facility] Brown, MD; Chambliss, MD; Eniola, MD; Hensel, MD; McDiarmid, MD; McIntyer, MD [address] [place], [place] [address] [phone] Mon-Fri 8:30-12:30, 1:30-5:00  Providers come to see babies during newborn hospitalization Only accepting infants of Mother's who are seen at Family Medicine Center or have siblings seen at   [facility] Center Medicaid - Yes; Tricare - Yes   [facility] Mulberry, MD [address] [place], [place] [address] [phone] Mon, Tue, Thur, Fri 8:30-5:00, Wed 10:00-7:00 (closed 1-2pm daily for lunch) Takes [place] residents with no insurance.  [place] [facility] only with Medicaid/insurance; Tricare - no  [facility] Ben-Davies, MD; Brown, MD; Chandler, MD; Ettefagh, MD; Grant, MD; Hanvey, MD; Herrin, MD; Jones,  MD; Lester, MD; McCormick, MD; McQueen, MD; Simha, MD; Stanley, MD; Stryffeler, NP [address], [place], [place] [address] [phone] Mon, Tue, Thur, Fri 8:30-5:30, Wed 9:30-5:30, Sat 8:30-12:30 Only accepting infants of first-time parents or siblings of   current patients Hospital discharge coordinator will make follow-up appointment Medicaid - yes; Tricare - yes  [place] ([address]) [facility] Cox, MD; Davis, MD; Dovico, MD; Ettefaugh, MD; Lowe, MD; Nation, MD; Slimp, MD; Sumner, MD; Williams, MD [address], [place], [place] [address] [phone] Mon-Fri 8:30-5:00, closed for lunch 12:30-1:30; Sat-Sun 10:00-1:00 Accepting Newborns with commercial insurance only, must call prior  to delivery to be accepted into  practice.  Medicaid - no, Tricare - yes   [facility] [address] [place], [place] [address] [phone] or [phone] Mon to Fri 8am to 10pm, Sat 8am to 1pm (virtual only on weekends) Only accepts Medicaid Healthy Blue pts  [facility]  Artis, MD; Coccaro, MD; Lockett Gardner, MD; Netherton, NP; Roper, MD; Wilmot, PA-C; Skinner, MD [address] [place], [place] [address] [phone] Mon-Fri 8:30-5:30 Medicaid - yes, Tricare - yes  [place] ([address]) [facility] Warner, MD [address], [place], [place] [address] [phone] Mon, Tues, Wed Fri 8:30-5:00, Sat 8:30-12:00, Closed Thursdays Accepting siblings of established patients and first time mom's if you call prenatally Medicaid- yes; Tricare - yes  [facility] Becker, PA; Hagler, MD; Quinn, PA-C; Scifres, PA; Sun, MD; Swayne, MD;  [address], [place], [place] [address] [phone] Mon-Fri 8:30-5:00, closed for lunch 1-2 Only accepting newborns of established patients Medicaid- no; Tricare - yes  [place] ([address]) [facility] Timberlake, MD; [address], [place], [place] [address] [phone] Mon-Fri 8:00-5:00 Medicaid - No; Tricare - Yes  [facility] [employer]  Brake, NP; Wharton, PA [address], [place], [place] [address] [phone] Mon-Fri 8:00-5:00 Medicaid - No, Tricare - Yes  [facility] Gay, MD; Quinlan, MD; Blatt, DNP [address] [place], [place] [address] [phone]  Mon-Fri 8:00-5:00 Medicaid - No; Tricare - Yes  [facility] [address] [place], [place] [address] [phone] Mon-Fri 8:30-5:00 (lunch 12:00-1:00) Medicaid -Yes; Tricare - Yes  [facility] Jordan, MD [address], [place], [place] [address] [phone] Mon-Fri 8:00-5:00 Seeing newborns of current patients only. No new patients Medicaid - No, Tricare - yes  [facility] Parker, MD [address] [place], [place] [address] [phone] Mon-Fri 8:00-5:00 Medicaid -yes as secondary coverage only; Tricare - yes  [facility] Brecken, PA; Christy, NP; Dees, MD; DeClaire, MD; DeWeese, MD; Hodge, PA; Smoot, NP; Summer, MD; Vapne, MD [address] [place], [place] [address] [phone] Mon-Fri 8:30-5:00, Sat 9:00-11:00 Accepts commercial insurance ONLY. Offers free prenatal information sessions for families. Medicaid - No, Tricare - Call first  [facility] Bouska, MD; Gordon, PA; Jeffery, PA; Weber, PA [address] [place] [address] [phone] Mon-Fri 7:30-5:30 Medicaid - Yes; Tricare - yes  [place] ([address] & [address])  [facility] Reese, MD [address] [place], [place] [address] [phone] Mon-Thur 8:00-6:00, closed for lunch 12-2, closed Fridays Medicaid - yes; Tricare - no  [facility] Anderson, NP; Badger, MD; Beal, PA; Spencer, PA [address], [place], [place] [address] [phone] Mon-Fri 7:30-4:30 Medicaid - yes, Tricare - yes  [facility]  Agbuya, MD; Klett, NP; Romgoolam, MD; Rothstein, NP [address], , [place] [address] [phone] Mon-Fri 8:30-5:00, closed for lunch 1-2, Sat 8:30-12:00 - sick visits only Providers come to   see babies at [facility] Only accepting newborns of siblings and first time parents ONLY if who have met with office prior to delivery Medicaid -Yes; Tricare - yes  [facility] [place]  Golden, DO; Friddle, NP; Wallace, MD; Wood, MD:  [address], [place], [place] [address] [phone] Mon- Fri 8:00-5:00, Sat 9:00-12:00 -  sick visits only Accepting siblings of established patients and first time mom/baby Medicaid - Yes; Tricare - yes Patients must have vaccinations (baby vaccines)  [place] ([address] & [address])  [facility] [address] [place], [place] [address] [phone] Mon-Fri 8:00-5:00 Medicaid - no; Tricare - yes  [facility] Briscoe, MD; Schmidt, PA; Moreira, PA [address], [place], [place] [address] [phone] Mon-Fri 8:00-5:00 Medicaid- yes; Tricare - yes  [facility] Boyd, MD; Jones, NP; Osborn, PA [address], , [place] [address] [phone] Mon-Fri 8:00-5:00 Medicaid - Yes; Tricare - yes  [place] ([address])  [facility] Atkinson, PA; Calderon, PA; Cummings, MD; Dillard, MD; Henrish, NP; Isenhour, DO; Martin, PA; Olson, MD; Ott, MD; Phillips, MD; Valente, PA; VanDeven, PA; Yonjof, NP [address], [place], [place] [address] [phone] Mon-Fri 8:30-5:00, Sat 9:00-12:00 - sick only Please register online [email or web address] then schedule online or call office Medicaid-Yes; Tricare -yes  [facility]  Dabrusco, MD; Dial, MD; Kouts, MD; Fleenor, NP; Goolsby, PA; Tonuzi, MD; Turner, NP; West, MD [address], [place], [place] [address] [phone] Mon-Fri 8:00-5:30, Sat&Sun by appointment (phones open at 8:30) Medicaid - Yes; Tricare - yes  [place] ([address]) [facility] Allen, CPNP; Bates, MD; Gordon, MD; Mills, NP; Weinshilboum, DO [address], [place], [place] [address] [phone] M-F 8:00 - 5:15, Sat/Sun 9-12 sick visits only Medicaid - No; Tricare - yes  [facility] [place] [facility]  Brown, PA-C; Cowen, PA-C; Dennis, DO; Fuster, PA-C; Martin, PA-C;  Shelton, PA-C; Spry, MD [address] [place] [address] [place], [place] [address] [phone] Mon-Thur 8:00-7:00, Fri 8:00-5:00 Accepting Medicaid for 13 and under only   [facility] [place] [facility] [place] Hayes, FNP; List, FNP; Moran, MD; Pitonzo, PA-C; Scholer, MD; Spangle, FNP; Nzenwa, FNP; Jasper, MD; Moran, MD [address] [place], [place] [address] [phone] Mon-Fri 8:30-5:30 Medicaid - Yes; Tricare - yes  [facility] [address] [facility]  Kelly, CPNP; [place], MD; Poth, MD; Ramadoss, MD; Staton, NP [address], [place], [place] [address] [phone] Mon-Thur 8:00-5:30, Fri 8:00-5:00, Sat 9:00-12:00 Medicaid - yes, Tricare - yes  [place] ([address])  [facility] [place] Masneri, DO; Meyers, MD; Nelson, PA [address], [place], [place] [address] [phone] Mon-Fri 8:00-5:00, closed for lunch 12-1 Medicaid - No; Tricare - yes  [facility] [place] McGowen, MD [address], [place], [place] [address] [phone] Mon-Fri 8:00-5:00 Medicaid - No; Tricare - yes  [facility] [place] MacDonald, MD; Nayak, MD; Kearns, MD; Jones, MD [address] [place] [address], [place], [place] [address] [phone] Mon-Fri 8:00-5:00 Medicaid- Yes; Tricare - yes  [place] ([address])  [facility] [place] Martin, PA-C; Tabori, MD [address], [place],    [address] [phone] Mon-Fri 8:00-5:00 Medicaid - No; Tricare - yes  [facility] [place]  Margin - CPNP [address], [place], [place] [address] [phone] Mon-Weds 8:00-6:00, Thurs-Fri 8:00-5:00, Sat 9:00-12:00 Medicaid - yes; Tricare - yes   [facility] [place] Aubuchon, MD; Brandon, PA [address] [place], [place] [address] [phone] Mon-Fri 8:00-5:00 Medicaid - yes; Tricare - yes  [place] Pediatric Providers  [facility] [address], [place], [place] [address] [phone] M, Thur: 8am -8pm, Tues, Weds: 8am - 5pm; Fri: 8-1 Medicaid - Yes; Tricare - yes  [facility] Mertz, MD; Johnson, MD; Wells, MD; Downs, PA; Hockenberger, PA [address], [place], [place] [address] [phone] M-F 8:30 - 5:00 Medicaid - Call office; Tricare -yes  [facility] Bonney, MD; Page, MD, Minter, MD; Mueller, PNP; Thomason, NP [address], [place], [place] [address] [phone] M-F 8:30 - 5:00, Sat/Sun 8:30 - 12:30 (sick visits) Medicaid - Call office; Tricare -yes  [facility] Lewis, MD; Shaub, PNP; Boylston, MD; Quaile, PA; Nonato, NP; Landon, CPNP [address], [place], [place] [address] [phone] M-F 8:30 - 5:00 Medicaid - Call office; Tricare - yes  [facility] [place] Cline, MD; Dvergsten, MD; Flores, MD; Kawatu, MD; Nogo, MD [address], [place], [place] [address] [phone] M-Thur: 8:00 - 5:00; Fri: 8:00 - 4:00 Medicaid - yes; Tricare - yes  [facility] [address] [place], [place], [place] [address] [phone] M-F: 8:30- 5:00, closed for lunch 12:30 - 1:00 Medicaid - yes; Tricare -yes  [facility] [place] [address], [place], [place] [address] [phone] M-F 8:00 - 5:00 Medicaid - yes; Tricare - yes  [facility] Johnson, DO; Rumball, DO; Wicker, NP [address] [place], [place], [place] [address] [phone] M-F 8:00 - 5:00, Closed 12-1 for lunch Medicaid - Call; Tricare - yes  [facility] Stein, MD [address], [place], [place] [address] [phone] M-F: 8:00-5:00, Sat: 8:00 - noon Medicaid - call; Tricare -yes  [place] [facility] Providers  [facility] Collins, FNP-C [address], [place], [place] [address] [phone] M-W: 8:00-5:00, Thur:  8:00 - 7:00, Fri: 8:00 - noon Medicaid - yes; Tricare - yes  [facility] [place] Adams, FNP [address], [place], [place] [address] [phone] M-F 8:00 - 5:00, Closed for lunch 12-1 Medicaid - yes; Tricare - yes  [place] [facility] Providers  [facility] Smith, FNP, Melvin, MD, Fay, FNP-C [address] [place] [address] [place], [place], [place] [address] [phone] M-T 8:00-5:00, Wed-Fri 7:00-6:00 Medicaid - Yes; Tricare -yes  [facility] [place] Civiletti, DO; [address], [place], [place] [address] [phone] M-F 8:00 - 5:00, closed for lunch 12-1 Medicaid - Yes; Tricare - yes  [facility]  Barnes, MD; Bergdolt, MD; Caulfield, MD; Emrich, MD; Fiscus, MD; Hoppens, MD; Kylstra, MD, McPherson, MD; Todd, MD; Prestwood, MD; Waters, MD; Wood, MD [address], [place], [place] [address] [phone] M-F 8:00-5:00 Medicaid - yes; Tricare - yes  [facility] Cheema, MD (speaks Punjabi and Hindi) [address] [place], [place] [address] [phone] M-F: 8:30 - 5:00, closed 12:30 - 1 for lunch Medicaid - Yes; Tricare -yes  [place] [facility] Providers  [facility] Loda, MD; Timberlake, MD;   Burke, MD [address], [place], [place] [address] [phone] M-Th: 8:00 - 5:30, Fri: 8:00 - 12:00 Medicaid - yes; Tricare - yes  [facility] [place] Lookabill, NP; Meier, MD; Daffron, MD [address], [place], [place] [address] [phone] M-F: 8:00 - 5:00 Medicaid - yes; Tricare - yes  [facility] Busse, NP; Bowman, NP; Baune, NP; Entwistle, MD; Williams, MD, Huffman, NP, Ferguson, MD; Patel, DO [address], [place], [place] [address] [phone] M-F: 8:30 - 5:30p Medicaid - yes; Tricare - yes Other locations available as well  [place]  [facility] Rajan, MD; Wilson, MD; Morgan, PA-C, Domenech, PA-C; Myers, PA-C [address], [place], [place] [address] [phone] M-W: 8:00am - 7:00pm, Thurs: 8:00am - 8:00pm; Fri: 8:00am - 5:00pm, closed daily from 12-1 for lunch Medicaid - yes; Tricare - yes  [place] [facility] Providers  [facility] [place] Adams, MD; Crystal, FNP; Hadley, MD; Stokes, MD; Johnson, PNP; Brady, PA-C; West, PNP; Gardner, MD;  [address], [place], [place] [address] [phone] M - Fri: 8am - 5pm, Sat 9-noon Medicaid - Yes; Tricare -yes  [facility] [place] Nayak, MD; Jones, FNP; McDonald, MD; Kearns, MD [address] [place] [address], [place], [place] [phone] M-F 8:00 - 5:00 Medicaid - call; Tricare - yes  [facility] [place] Bell, MD; Emory, PNP; Pinder, MD; Anderson, MD; Light, PA-C; Johnson, MD; Latta, MD; Saul, PNP; Rainey, MD; Clifford, MD; McClung, MD [address], [place], [place] [address] [phone] M-F 8:00am - 5:00pm; Sat. 9:00 - 11:00 Medicaid - yes; Tricare - yes  [facility] [place] Soldato-Couture, MD [address], [place], [place] [address] [phone] M-F 8:00 - 5:00 Medicaid - [place] Medicaid only; Tricare - yes  [facility] [place] Walker, MD; Davis, PNP; Ajizian, MD [address] [place] [address], [place], [place] [address] [phone] M-F 8:00 - 5:00 Medicaid - yes; Tricare - yes  [facility] [place] Barry, MD; Brown, MD, Forest, MD, Hazek, MD; Hoyle, MD; Smith, MD; [address], [place], [place] [address] [phone] M-F: 8-5 Medicaid - yes; Tricare - yes  [facility] [place] Brady, Md; Dowlen, MD; [address] [place] [address], [place], [place] [address] [phone] M-F 8-5 Medicaid - yes; Tricare - yes  [facility] Kearns, MD; Nayak, MD; Soldato-Courture, MD; Pellam-Palmer,  DNP; Herring, PNP [address], [place], [place] [address] [phone] M-F 8-5 Medicaid - yes; Tricare - yes  [facility] Weathers, MD; Merritt, PA-C; Davis-PA-C; Warnimont, MD [address], [place], [place] [address] [phone] M-F 7am - 5 pm Medicaid - call; Tricare - yes  [facility] Hill, PNP; Erickson, MD; Robinson, MD [address], [place], [place] [address] [phone] M-F 8-5 Medicaid - yes; Tricare - yes  [facility] Kribbs, MD; Warner, MD; Williams, FNP; Brooks, FNP; Boles, FNP; Romblad, PA-C; [place] - FNP [address], [place],  [address] [phone] M-F 8-5 Medicaid- yes; Tricare - yes  [facility] [place] [place] [place] [facility] [place] Yoder, MD; Verenes, MD; Armentrout, MD; Stewart, MD; Beasley, CPNP; Ford, MD; Erickson, MD; Rice, MD [address], [place],  [address] [phone] M-F: 8-5, Sat: 9-4, Sun 9-12 Medicaid - yes; Tricare - yes  [facility] Little, PNP; Davis, PNP [address], [place], [place] [address] [phone] M-F 8 - 5, closed 12-1 for lunch Medicaid - yes; Tricare - yes  [facility] Friesen, MD; Cnegia, MD; Rice, MD; Patel, DO [address], [place], [place] [address] [phone] M-F 8- 5:30 Medicaid - yes; Tricare - yes  [facility] [place] Zvolensky, MD; Ray, MD; Haas, MD [address] [address], [place], [place] [address] [phone] M: 8-7; Tues-Fri: 8-5; Sat: 9-12 Medicaid - yes; Tricare - yes  [facility] [place] Heinrich, MD; Meyer, MD; Clark, MD; Rhyne, MD; Aubuchon, MD [address], [place], [place] [address] [phone] M: 8-7; Tues-Fri: 8-5; Sat: 8:30-12:30 Medicaid -  yes; Tricare - yes  [facility] [place] Bista, MD; Dillard, PA [address], [place], [place] [address] [phone] Mon-Fri: 8-5 Medicaid - yes; Tricare - yes  [facility] [place] Beasley, CPNP; Mahle, CPNP; Rice, MD; Duffy, MD; Culler, MD; [address], [place], [place] [address] [phone] M-F: 8-5, closed 1-2 for lunch Medicaid - yes; Tricare - yes  [facility] Rickman, PA; Mounce, NP; Smith, MD; Jordan, CPNP; Darty, PA; Ball, MD; Wallace, MD [address] [place] [address], [place], [place] [address] [phone] M-Thurs: 8-7; Fri: 8-6; Sat: 9-12; Sun 2-4 Medicaid - yes; Tricare - yes  [facility] Brown, MD; Shin, MD; Goodman, DNP, FNP; Sebesta, DO; [address], [place], [place] [address] [phone] M-F: 8-5 Medicaid - yes; Tricare - yes  [place] [facility] Providers  [facility] Dough, MD; Welsh, NP [address], [place], [place] [address] [phone] M - Fri: 8am - 5pm, closed for lunch 12-1 Medicaid - Yes; Tricare - yes  [facility] Manandhar, MD; Riley, MD; Sanger, DO; Vinocur, MD;Hall, PA; Walsh, PA; Campbell, NP [address], [place] [address] [phone] M-F 8:00 - 5:00, Sat 8:00 - 11:30 Medicaid - yes; Tricare - yes  [facility] Khan, MD; Redding, MD, Street, MD, Holt, MD, Burgart, MD; Rhyne, NP; Dickinson, PA;  [address] [place] [address], [place], [place] [address] [phone] M-F 8:10am - 5:00pm Medicaid - yes; Tricare - yes  [facility] Connors, MD; Kime, NP [address], [place], [place] [address] [phone] M-F 8:00 - 5:00 Medicaid - [place] Medicaid only; Tricare - yes  [facility] [place] Whyte, MD; Fox, NP [address], [place], [place] [address] [phone] M-F 8:00 - 5:00; Closed for lunch 12 - 1:00 Medicaid - yes; Tricare - yes  [facility] Penner, MD; Wilburn, FNP [address], , [place] [address] [phone] Mon 9-5; Tues/Wed 10-5; Thurs 8:30-5; Fri: 8-12:30 Medicaid - yes; Tricare - yes  [place] [facility] Providers  [facility]  Golding, MD; Jackson, PA-C [address], [place], [place] [address] [phone] phone [phone] fax M-F 7:15 - 4:30 Medicaid - yes; Tricare - yes  [facility] Gosrani, MD; Meccariello, DO [address] [place], [place] [address] [phone] M-Fri: 8:30 - 5:00, closed for lunch everyday noon - 1pm Medicaid - Yes; Tricare - yes  [facility] Burdine, MD; Daniel, MD; Howard, MD; Sasser, MD; Boles, PA; Boyd, PA-C; Carroll, PA; McGee, PA;   Skillman, PA; Wilson, PA [address] [place], [place] [address] [phone] M-Thurs: 7:30am - 7:00pm; Friday 7:30am - 4pm; Sat: 8:00 - 1:00 Medicaid - Yes; Tricare - yes  [facility] [place] Akhbari, MD; Law, MD; Qayumi, MD; Salvador, DO [address], [place], [place] [address] [phone] M-Thur: 8:00 - 5:00, Fri: 8:00 - Noon Medicaid - yes; Tricare - yes No [facility]  [facility] [place] [facility] Dettinger, MD; Gottschalk, DO; Hawks, NP; Martin, NP; Morgan, NP; Milian, NP; Rakes, NP; Stacks, MD; Webster, PA [address], [place], [place] [address] [phone] M-F 8:00 - 5:00 Medicaid - yes; Tricare - yes  [facility] Collins, FNP-C; Bucio, FNP-C [address], [place], [place] [address] [phone] M, W, R 8:00-5:00, Tues: 8:00am - 7:00pm; Fri 8:00 - noon Medicaid - Yes; Tricare - yes  [facility] Khan, MD [address]  [place] [address] [place], [place] [address] [phone]  M-Thurs 8:30-5:30, Fri: 8:30-12:30pm Medicaid - Yes; Tricare - N      Considering Waterbirth? Guide for patients at [facility] Why consider waterbirth? Gentle birth for babies  Less pain medicine used in labor  May allow for passive descent/less pushing  May reduce perineal tears  More mobility and instinctive maternal position changes  Increased maternal relaxation   Is waterbirth safe? What are the risks of infection, drowning or other complications? Infection:  Very low risk (3.7 % for tub vs 4.8% for bed)  7 in 8000 waterbirths with documented infection  Poorly cleaned equipment most common cause  Slightly lower group B strep transmission rate  Drowning  Maternal:  Very low risk  Related to seizures or fainting  Newborn:  Very low risk. No evidence of increased risk of respiratory problems in multiple large studies  Physiological protection from breathing under water  Avoid underwater birth if there are any fetal complications  Once baby's head is out of the water, keep it out.  Birth complication  Some reports of cord trauma, but risk decreased by bringing baby to surface gradually  No evidence of increased risk of shoulder dystocia. Mothers can usually change positions faster in water than in a bed, possibly aiding the maneuvers to free the shoulder.   There are 2 things you MUST do to have a waterbirth with [facility]: Attend a waterbirth class at [facility] [place]   3rd Wednesday of every month from 7-9 pm (virtual during COVID) Free Register online at [email or web address] or [email or web address] or by calling [phone] Bring us the certificate from the class to your prenatal appointment or send via MyChart Meet with a midwife at 36 weeks* to see if you can still plan a waterbirth and to sign the consent.   *We also recommend that you schedule as many of  your prenatal visits with a midwife as possible.    Helpful information: You may want to bring a bathing suit top to the hospital to wear during labor but this is optional.  All other supplies are provided by the hospital. Please arrive at the hospital with signs of active labor, and do not wait at home until late in labor. It takes 45 min- 1 hour for fetal monitoring, and check in to your room to take place, plus transport and filling of the waterbirth tub.    Things that would prevent you from having a waterbirth:   Premature, <37wks  Previous cesarean birth  Presence of thick meconium-stained fluid  Multiple gestation (Twins, triplets, etc.)  Uncontrolled diabetes or gestational diabetes requiring medication  Hypertension diagnosed in pregnancy or preexisting hypertension (gestational hypertension, preeclampsia, or chronic hypertension) Fetal growth restriction (your baby measures less than 10th percentile on ultrasound) Heavy vaginal bleeding  Non-reassuring fetal heart rate  Active infection (MRSA, etc.). Group B Strep is NOT a contraindication for waterbirth.  If your labor has to be induced and induction method requires continuous monitoring of the baby's heart rate  Other risks/issues identified by your obstetrical provider   Please remember that birth is unpredictable. Under certain unforeseeable circumstances your provider may advise against giving birth in the tub. These decisions will be made on a case-by-case basis and with the safety of you and your baby as our highest priority.     

## 2022-11-30 ENCOUNTER — Ambulatory Visit: Payer: Medicaid Other | Attending: Certified Nurse Midwife

## 2022-11-30 ENCOUNTER — Ambulatory Visit (INDEPENDENT_AMBULATORY_CARE_PROVIDER_SITE_OTHER): Payer: Medicaid Other | Admitting: Certified Nurse Midwife

## 2022-11-30 ENCOUNTER — Other Ambulatory Visit: Payer: Medicaid Other

## 2022-11-30 ENCOUNTER — Encounter: Payer: Self-pay | Admitting: *Deleted

## 2022-11-30 ENCOUNTER — Encounter: Payer: Self-pay | Admitting: Certified Nurse Midwife

## 2022-11-30 ENCOUNTER — Other Ambulatory Visit (HOSPITAL_COMMUNITY)
Admission: RE | Admit: 2022-11-30 | Discharge: 2022-11-30 | Disposition: A | Discharge status: Home / Self Care | Payer: Medicaid Other | Source: Ambulatory Visit | Attending: Certified Nurse Midwife | Admitting: Certified Nurse Midwife

## 2022-11-30 ENCOUNTER — Ambulatory Visit: Payer: Medicaid Other | Admitting: *Deleted

## 2022-11-30 ENCOUNTER — Other Ambulatory Visit: Payer: Self-pay

## 2022-11-30 ENCOUNTER — Other Ambulatory Visit: Payer: Self-pay | Admitting: *Deleted

## 2022-11-30 VITALS — BP 90/74 | HR 74 | Wt 187.8 lb

## 2022-11-30 VITALS — BP 121/68 | HR 74

## 2022-11-30 DIAGNOSIS — N881 Old laceration of cervix uteri: Secondary | ICD-10-CM

## 2022-11-30 DIAGNOSIS — O0992 Supervision of high risk pregnancy, unspecified, second trimester: Secondary | ICD-10-CM

## 2022-11-30 DIAGNOSIS — O09522 Supervision of elderly multigravida, second trimester: Secondary | ICD-10-CM

## 2022-11-30 DIAGNOSIS — O0990 Supervision of high risk pregnancy, unspecified, unspecified trimester: Secondary | ICD-10-CM

## 2022-11-30 DIAGNOSIS — D259 Leiomyoma of uterus, unspecified: Secondary | ICD-10-CM

## 2022-11-30 DIAGNOSIS — Z3A20 20 weeks gestation of pregnancy: Secondary | ICD-10-CM

## 2022-11-30 DIAGNOSIS — Z31430 Encounter of female for testing for genetic disease carrier status for procreative management: Secondary | ICD-10-CM

## 2022-11-30 DIAGNOSIS — O09529 Supervision of elderly multigravida, unspecified trimester: Secondary | ICD-10-CM | POA: Insufficient documentation

## 2022-11-30 DIAGNOSIS — O0932 Supervision of pregnancy with insufficient antenatal care, second trimester: Secondary | ICD-10-CM

## 2022-11-30 DIAGNOSIS — R1012 Left upper quadrant pain: Secondary | ICD-10-CM

## 2022-11-30 DIAGNOSIS — O3412 Maternal care for benign tumor of corpus uteri, second trimester: Secondary | ICD-10-CM | POA: Diagnosis not present

## 2022-11-30 DIAGNOSIS — E282 Polycystic ovarian syndrome: Secondary | ICD-10-CM | POA: Insufficient documentation

## 2022-11-30 DIAGNOSIS — Z3490 Encounter for supervision of normal pregnancy, unspecified, unspecified trimester: Secondary | ICD-10-CM

## 2022-11-30 NOTE — Progress Notes (Signed)
History:   [name] is a 37 y.o. [phone] at [redacted]w[redacted]d by early ultrasound being seen today for her first obstetrical visit.  Her obstetrical history is significant for advanced maternal age and SVD . Patient does intend to breast feed. Pregnancy history fully reviewed.  Patient reports  some abdominal pain after drinking red raspberry leaf tea.  Marland Kitchen      HISTORY: OB History  Gravida Para Term Preterm AB Living  4 1 1  0 2 1  SAB IAB Ectopic Multiple Live Births  1 1 0 0 1    # Outcome Date GA Lbr Len/2nd Weight Sex Type Anes PTL Lv  4 Current           3 SAB 2009          2 Term 06/19/05    F Vag-Spont EPI  LIV  1 IAB 2005            Unsure of last PAP and was normal per patient report.   Past Medical History:  Diagnosis Date   Medical history non-contributory    Past Surgical History:  Procedure Laterality Date   DILATION AND CURETTAGE OF UTERUS     Family History  Problem Relation Age of Onset   Asthma Mother    Cancer Father    Asthma Sister    Asthma Brother    Diabetes Maternal Aunt    Diabetes Maternal Uncle    Diabetes Maternal Grandmother    Diabetes Maternal Grandfather    Hypertension Paternal Grandmother    Hypertension Paternal Grandfather    Social History   Tobacco Use   Smoking status: Never   Smokeless tobacco: Never  Vaping Use   Vaping status: Never Used  Substance Use Topics   Alcohol use: Yes    Comment: Socially   Drug use: No   Allergies  Allergen Reactions   Sulfa Antibiotics Hives and Swelling   Tomato Other (See Comments)    "Lips and throat itch and swell"   Current Outpatient Medications on File Prior to Visit  Medication Sig Dispense Refill   Blood Pressure Monitoring (BLOOD PRESSURE KIT) DEVI 1 each by Does not apply route as needed. 1 each 0   lactobacillus acidophilus (BACID) TABS tablet Take 2 tablets by mouth 3 (three) times daily. (Patient not taking: Reported on 11/30/2022)     metroNIDAZOLE (FLAGYL) 500 MG tablet  Take 1 tablet (500 mg total) by mouth 2 (two) times daily. 14 tablet 0   Prenatal Vit-Fe Fumarate-FA (MULTIVITAMIN-PRENATAL) 27-0.8 MG TABS tablet Take 1 tablet by mouth daily at 12 noon.     cephALEXin (KEFLEX) 500 MG capsule Take 500 mg by mouth daily. (Patient not taking: Reported on 11/29/2022)     ECHINACEA PO Take 1 tablet by mouth daily. (Patient not taking: Reported on 11/29/2022)     ELDERBERRY PO Take 1 tablet by mouth daily. (Patient not taking: Reported on 11/29/2022)     ferrous sulfate 325 (65 FE) MG tablet Take 1 tablet (325 mg total) by mouth daily. (Patient not taking: Reported on 11/29/2022) 30 tablet 0   ibuprofen (ADVIL,MOTRIN) 800 MG tablet Take 800 mg by mouth every 8 (eight) hours as needed for fever or headache. (Patient not taking: Reported on 11/29/2022)     oseltamivir (TAMIFLU) 75 MG capsule Take 1 capsule (75 mg total) by mouth every 12 (twelve) hours. (Patient not taking: Reported on 11/29/2022) 10 capsule 0   SPIRULINA PO Take 1 tablet by  mouth daily. (Patient not taking: Reported on 11/29/2022)     TURMERIC PO Take 1 tablet by mouth daily. (Patient not taking: Reported on 11/29/2022)     No current facility-administered medications on file prior to visit.    Review of Systems Pertinent items noted in HPI and remainder of comprehensive ROS otherwise negative.  Indications for ASA therapy (per UpToDate) One of the following: Previous pregnancy with preeclampsia, especially early onset and with an adverse outcome No Multifetal gestation No Chronic hypertension No Type 1 or 2 diabetes mellitus No Chronic kidney disease No Autoimmune disease (antiphospholipid syndrome, systemic lupus erythematosus) No Two or more of the following: Nulliparity No Obesity (body mass index >30 kg/m2) No Family history of preeclampsia in mother or sister No Age ?35 years Yes Sociodemographic characteristics (African American race, low socioeconomic level) Yes Personal risk factors (eg,  previous pregnancy with low birth weight or small for gestational age infant, previous adverse pregnancy outcome [eg, stillbirth], interval >10 years between pregnancies) No  Indications for early GDM screening (FBS, A1C, Random CBG, GTT) First-degree relative with diabetes No BMI >30kg/m2 No Age > 35 Yes Previous birth of an infant weighing ?4000 g No Gestational diabetes mellitus in a previous pregnancy No Glycated hemoglobin ?5.7 percent (39 mmol/mol), impaired glucose tolerance, or impaired fasting glucose on previous testing No High-risk race/ethnicity (eg, African American, Latino, Native American, Asian American, Pacific Islander) Yes Previous stillbirth of unknown cause No Maternal birthweight > 9 lbs No History of cardiovascular disease No Hypertension or on therapy for hypertension No High-density lipoprotein cholesterol level <35 mg/dL (1.61 mmol/L) and/or a triglyceride level >250 mg/dL (0.96 mmol/L) No Polycystic ovary syndrome No Physical inactivity No Other clinical condition associated with insulin resistance (eg, severe obesity, acanthosis nigricans) No Current use of glucocorticoids No   Physical Exam:   Vitals:   11/30/22 0824  BP: 90/74  Pulse: 74  Weight: 187 lb 12.8 oz (85.2 kg)   Fetal Heart Rate (bpm): 141   General: well-developed, well-nourished female in no acute distress  Skin: normal coloration and turgor, no rashes  Neurologic: oriented, normal, negative, normal mood  Extremities: normal strength, tone, and muscle mass, ROM of all joints is normal  HEENT PERRLA, extraocular movement intact and sclera clear, anicteric  Neck supple and no masses  Cardiovascular: regular rate and rhythm  Respiratory:  no respiratory distress, normal breath sounds  Abdomen: soft, non-tender; bowel sounds normal; no masses,  no organomegaly  Pelvic: normal external genitalia, no lesions, normal vaginal mucosa, normal vaginal discharge, normal cervix, pap smear done.  Exam done in the presence of a chaperone.     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 Supervision of high risk pregnancy in second trimester - Patient doing well - She reports vigorous and frequent fetal movement.  - Partner "Aryn" present for delivery and supportive.  - CBC/D/Plt+RPR+Rh+ABO+RubIgG... - Hemoglobin A1c - AFP, Serum, Open Spina Bifida - Cytology - PAP( [place])  2. Multigravida of advanced maternal age in second trimester - CBC/D/Plt+RPR+Rh+ABO+RubIgG... - Hemoglobin A1c - AFP, Serum, Open Spina Bifida - Cytology - PAP( [place])  3. [redacted] weeks gestation of pregnancy - Reviewed 2nd trimester expectations  - CBC/D/Plt+RPR+Rh+ABO+RubIgG... - Hemoglobin A1c -  AFP, Serum, Open Spina Bifida - Cytology - PAP( [place])  4. Left upper quadrant abdominal pain - During discussion patient noted to have multiple cups of RRL Tea.  - Recommended to start drinking tea at 37 weeks. Patient verbalized understanding   5. Encounter of female for testing for genetic disease carrier status for procreative management - HORIZON CUSTOM  6. Pregnancy - PANORAMA PRENATAL TEST  7. PCOS (polycystic ovarian syndrome) - Stable   8. 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  - Cervix visualized today and noted to be a well healed, non-bleeding tear to the right opening of the cervix. Overall appears closed, the cervical opening just appears elongated.    Initial labs drawn. Continue prenatal vitamins. Problem list reviewed and updated. Genetic Screening discussed, Panorama and Horizon: requested. Ultrasound discussed; fetal anatomic  survey: scheduled got today . Anticipatory guidance about prenatal visits given including labs, ultrasounds, and testing. Weight gain recommendations per IOM guidelines reviewed: underweight/BMI 18.5 or less > 28 - 40 lbs; normal weight/BMI 18.5 - 24.9 > 25 - 35 lbs; overweight/BMI 25 - 29.9 > 15 - 25 lbs; obese/BMI  30 or more > 11 - 20 lbs. Discussed usage of the Babys[employer]s app for more information about pregnancy, and to track blood pressures. Also discussed usage of virtual visits as additional source of managing and completing prenatal visits.  Patient was encouraged to use MyChart to review results, send requests, and have questions addressed.   The nature of [facility] with multiple MDs and Advanced Practice Providers was explained to patient; also emphasized that residents, students are part of our team. Routine obstetric precautions reviewed. Encouraged to seek out care at our office or emergency room [facility] preferred) for urgent and/or emergent concerns. Return in about 4 weeks (around 12/28/2022) for LOB.     Isaack Preble Danella Deis) Suzie Portela, MSN, CNM  [facility]  11/30/2022 10:27 PM

## 2022-12-03 ENCOUNTER — Encounter: Payer: Self-pay | Admitting: *Deleted

## 2022-12-17 ENCOUNTER — Ambulatory Visit: Payer: Medicaid Other

## 2022-12-17 ENCOUNTER — Other Ambulatory Visit: Payer: Medicaid Other

## 2022-12-28 ENCOUNTER — Other Ambulatory Visit: Payer: Self-pay

## 2022-12-28 ENCOUNTER — Ambulatory Visit: Payer: Medicaid Other | Admitting: Family Medicine

## 2022-12-28 VITALS — BP 101/69 | HR 74 | Wt 187.2 lb

## 2022-12-28 DIAGNOSIS — O0990 Supervision of high risk pregnancy, unspecified, unspecified trimester: Secondary | ICD-10-CM

## 2022-12-28 DIAGNOSIS — Z3A24 24 weeks gestation of pregnancy: Secondary | ICD-10-CM

## 2022-12-28 DIAGNOSIS — D563 Thalassemia minor: Secondary | ICD-10-CM

## 2022-12-28 DIAGNOSIS — Z148 Genetic carrier of other disease: Secondary | ICD-10-CM

## 2022-12-28 DIAGNOSIS — D259 Leiomyoma of uterus, unspecified: Secondary | ICD-10-CM

## 2022-12-28 DIAGNOSIS — O09522 Supervision of elderly multigravida, second trimester: Secondary | ICD-10-CM

## 2022-12-28 NOTE — Progress Notes (Signed)
   PRENATAL VISIT NOTE  Subjective:  [name] is a 37 y.o. G4P1021 at [redacted]w[redacted]d being seen today for ongoing prenatal care.  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  Patient reports no complaints.  Contractions: Irritability. Vag. Bleeding: None.  Movement: Present. Denies leaking of fluid.   The following portions of the patient's history were reviewed and updated as appropriate: allergies, current medications, past family history, past medical history, past social history, past surgical history and problem list.   Objective:   Vitals:   12/28/22 1041  BP: 101/69  Pulse: 74  Weight: 187 lb 3.2 oz (84.9 kg)    Fetal Status: Fetal Heart Rate (bpm): 140 Fundal Height: 95 cm Movement: Present     General:  Alert, oriented and cooperative. Patient is in no acute distress.  Skin: Skin is warm and dry. No rash noted.   Cardiovascular: Normal heart rate noted  Respiratory: Normal respiratory effort, no problems with respiration noted  Abdomen: Soft, gravid, appropriate for gestational age.  Pain/Pressure: Present     Pelvic: Cervical exam deferred        Extremities: Normal range of motion.  Edema: None  Mental Status: Normal mood and affect. Normal behavior. Normal judgment and thought content.   Assessment and Plan:  Pregnancy: G4P1021 at [redacted]w[redacted]d 1. Supervision of high risk pregnancy, antepartum   2. Multigravida of advanced maternal age in second trimester LR NIPT  3. 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 Alpha thalassemia silent carrier Partner testing  5. [redacted] weeks gestation of pregnancy   6. Uterine leiomyoma, unspecified location Seen on u/s-->has f/u  scheduled  Preterm labor symptoms and general obstetric precautions including but not limited to vaginal bleeding, contractions, leaking of fluid and fetal movement were reviewed in detail with the patient. Please refer to After Visit Summary for other counseling recommendations.   Return in 4 weeks (on 01/25/2023) for 28 wk labs, [facility].  Future Appointments  Date Time Provider Department Center  01/21/2023  8:15 AM Tereso Newcomer, MD [facility]  01/21/2023  8:50 AM [facility]  01/21/2023 10:30 AM [facility] US[facility]    Reva Bores, MD

## 2023-01-18 ENCOUNTER — Other Ambulatory Visit: Payer: Self-pay

## 2023-01-18 DIAGNOSIS — O0990 Supervision of high risk pregnancy, unspecified, unspecified trimester: Secondary | ICD-10-CM

## 2023-01-21 ENCOUNTER — Other Ambulatory Visit: Payer: Medicaid Other

## 2023-01-21 ENCOUNTER — Other Ambulatory Visit: Payer: Self-pay

## 2023-01-21 ENCOUNTER — Ambulatory Visit (INDEPENDENT_AMBULATORY_CARE_PROVIDER_SITE_OTHER): Payer: Medicaid Other | Admitting: Obstetrics & Gynecology

## 2023-01-21 ENCOUNTER — Ambulatory Visit: Payer: Medicaid Other | Attending: Maternal & Fetal Medicine

## 2023-01-21 VITALS — BP 109/67 | HR 80 | Wt 184.8 lb

## 2023-01-21 DIAGNOSIS — O0932 Supervision of pregnancy with insufficient antenatal care, second trimester: Secondary | ICD-10-CM | POA: Insufficient documentation

## 2023-01-21 DIAGNOSIS — O09523 Supervision of elderly multigravida, third trimester: Secondary | ICD-10-CM

## 2023-01-21 DIAGNOSIS — O0990 Supervision of high risk pregnancy, unspecified, unspecified trimester: Secondary | ICD-10-CM

## 2023-01-21 DIAGNOSIS — O3413 Maternal care for benign tumor of corpus uteri, third trimester: Secondary | ICD-10-CM | POA: Diagnosis not present

## 2023-01-21 DIAGNOSIS — O0993 Supervision of high risk pregnancy, unspecified, third trimester: Secondary | ICD-10-CM

## 2023-01-21 DIAGNOSIS — Z3A28 28 weeks gestation of pregnancy: Secondary | ICD-10-CM

## 2023-01-21 DIAGNOSIS — O0933 Supervision of pregnancy with insufficient antenatal care, third trimester: Secondary | ICD-10-CM

## 2023-01-21 DIAGNOSIS — O09522 Supervision of elderly multigravida, second trimester: Secondary | ICD-10-CM | POA: Diagnosis present

## 2023-01-21 DIAGNOSIS — D259 Leiomyoma of uterus, unspecified: Secondary | ICD-10-CM | POA: Insufficient documentation

## 2023-01-21 DIAGNOSIS — O3410 Maternal care for benign tumor of corpus uteri, unspecified trimester: Secondary | ICD-10-CM | POA: Diagnosis present

## 2023-01-21 NOTE — Patient Instructions (Signed)
RSV Vaccination for Pregnant People  CDC recommends two ways to protect babies from getting very sick with Respiratory Syncytial Virus (RSV):  An RSV vaccination given during pregnancy  Pfizer's vaccine Verdis Frederickson) is recommended for use during pregnancy. It is given during RSV season to people who are 32 through [redacted] weeks pregnant.  Or, An RSV immunization given directly to infants and some older babies  Babies born to mothers who get RSV vaccine at least 2 weeks before delivery will have protection and, in most cases, should not need an RSV immunization later.    When is RSV season?  In most regions of the [place] RSV season starts in the fall and peaks in the winter, but the timing and severity of RSV season can vary from place to place and year to year.   The goal of maternal RSV vaccination is to protect babies from getting very sick with RSV during their first RSV season.  In most of the [place], this means maternal RSV vaccine will be given in September through January.  Who should get the maternal RSV vaccine?  People who are 44 through [redacted] weeks pregnant during September through January should get one dose of maternal RSV vaccine to protect their babies. RSV season can vary around the country.   How is the maternal RSV vaccine administered?  Maternal RSV vaccine is given as a shot into the mother's upper arm. Only a single dose (one shot) of maternal RSV vaccine is recommended.   It is not yet known whether another dose might be needed in later pregnancies.  How well does the maternal RSV vaccine work?  When someone gets RSV vaccine, their body responds by making a protein that protects against the virus that causes RSV. The process takes about 2 weeks. When a pregnant person gets RSV vaccine, their protective proteins (called antibodies) also pass to their baby. So, babies who are born at least 2 weeks after their mother gets RSV vaccine are protected  at birth, when infants are at the highest risk of severe RSV disease.   The vaccine can reduce a baby's risk of being hospitalized from RSV by 57% in the first six months after birth.  What are the possible side effects of the maternal RSV vaccine?  In the clinical trials, the side effects most often reported by pregnant people who received the maternal RSV vaccine were pain at the injection site, headache, muscle pain, and nausea.  Although not common, a dangerous high blood pressure condition called pre-eclampsia occurred in 1.8% of pregnant people who received the maternal RSV vaccine compared to 1.4% of pregnant people who received a placebo.  The clinical trials identified a small increase in the number of preterm births in vaccinated pregnant people. It is not clear if this is a true safety problem related to RSV vaccine or if this occurred for reasons unrelated to vaccination.  To reduce the potential risk of preterm birth and complications from RSV disease, FDA approved the maternal RSV vaccine for use during weeks 32 through 59 of pregnancy while additional studies are conducted.  FDA is requiring the manufacturer to do additional studies that will look more closely at the potential risk of preterm births and pregnancy-related high blood pressure issues in mothers, including pre-eclampsia.  Severe allergic reactions to vaccines are rare but can happen after any vaccine and can be life-threatening. If you see signs of a severe allergic reaction after vaccination (hives, swelling of the face  and throat, difficulty breathing, a fast heartbeat, dizziness, or weakness), seek immediate medical care by calling 911.  As with any medicine or vaccine there is a very remote chance of the vaccine causing other serious injury or death after vaccination.  Adverse events following vaccination should be reported to the Vaccine Adverse Event Reporting System (VAERS), even if it's not clear that the vaccine  caused the adverse event. You or your doctor can report an adverse event to [facility] and FDA through VAERS. If you need further assistance reporting to VAERS, please email [email or web address] or call [phone].  If you have any questions about side effects from the maternal RSV vaccine, talk with your healthcare provider.  Do I need a prescription for a maternal RSV vaccine?  Until the vaccine available in the office, you will need a prescription to take to a local pharmacy that is providing the vaccine.   How do I pay for the maternal RSV vaccine?  Most private health insurance plans cover the maternal RSV vaccine, but there may be a cost to you depending on your plan.  Contact your insurer to find out.  Medicaid Beginning February 04, 2022, most people with coverage from [facility] and [employer] [facility]) will be guaranteed coverage of all vaccines recommended by the Advisory Committee on Immunization Practice at no cost to them.   Source: [facility] for Immunization and Respiratory Diseases TDaP Vaccine Pregnancy Get the Whooping Cough Vaccine While You Are Pregnant (CDC)  It is important for women to get the whooping cough vaccine in the third trimester of each pregnancy. Vaccines are the best way to prevent this disease. There are 2 different whooping cough vaccines. Both vaccines combine protection against whooping cough, tetanus and diphtheria, but they are for different age groups: Tdap: for everyone 11 years or older, including pregnant women  DTaP: for children 2 months through 68 years of age  You need the whooping cough vaccine during each of your pregnancies The recommended time to get the shot is during your 27th through 36th week of pregnancy, preferably during the earlier part of this time period. The Centers for Disease Control and Prevention (CDC) recommends that pregnant women receive the whooping cough vaccine for adolescents and adults (called Tdap  vaccine) during the third trimester of each pregnancy. The recommended time to get the shot is during your 27th through 36th week of pregnancy, preferably during the earlier part of this time period. This replaces the original recommendation that pregnant women get the vaccine only if they had not previously received it. The [employer] of [employer]stetricians and Gynecologists and the [employer] support this recommendation.  You should get the whooping cough vaccine while pregnant to pass protection to your baby frame support disabled and/or not supported in this browser  Learn why Vernona Rieger decided to get the whooping cough vaccine in her 3rd trimester of pregnancy and how her baby girl was born with some protection against the disease. Also available on YouTube. After receiving the whooping cough vaccine, your body will create protective antibodies (proteins produced by the body to fight off diseases) and pass some of them to your baby before birth. These antibodies provide your baby some short-term protection against whooping cough in early life. These antibodies can also protect your baby from some of the more serious complications that come along with whooping cough. Your protective antibodies are at their highest about 2 weeks after getting the vaccine, but it takes time to  pass them to your baby. So the preferred time to get the whooping cough vaccine is early in your third trimester. The amount of whooping cough antibodies in your body decreases over time. That is why CDC recommends you get a whooping cough vaccine during each pregnancy. Doing so allows each of your babies to get the greatest number of protective antibodies from you. This means each of your babies will get the best protection possible against this disease.  Getting the whooping cough vaccine while pregnant is better than getting the vaccine after you give birth Whooping cough vaccination during pregnancy is  ideal so your baby will have short-term protection as soon as he is born. This early protection is important because your baby will not start getting his whooping cough vaccines until he is 2 months old. These first few months of life are when your baby is at greatest risk for catching whooping cough. This is also when he's at greatest risk for having severe, potentially life-threating complications from the infection. To avoid that gap in protection, it is best to get a whooping cough vaccine during pregnancy. You will then pass protection to your baby before he is born. To continue protecting your baby, he should get whooping cough vaccines starting at 2 months old. You may never have gotten the Tdap vaccine before and did not get it during this pregnancy. If so, you should make sure to get the vaccine immediately after you give birth, before leaving the hospital or birthing center. It will take about 2 weeks before your body develops protection (antibodies) in response to the vaccine. Once you have protection from the vaccine, you are less likely to give whooping cough to your newborn while caring for him. But remember, your baby will still be at risk for catching whooping cough from others. A recent study looked to see how effective Tdap was at preventing whooping cough in babies whose mothers got the vaccine while pregnant or in the hospital after giving birth. The study found that getting Tdap between 27 through 36 weeks of pregnancy is 85% more effective at preventing whooping cough in babies younger than 2 months old. Blood tests cannot tell if you need a whooping cough vaccine There are no blood tests that can tell you if you have enough antibodies in your body to protect yourself or your baby against whooping cough. Even if you have been sick with whooping cough in the past or previously received the vaccine, you still should get the vaccine during each pregnancy. Breastfeeding may pass some  protective antibodies onto your baby By breastfeeding, you may pass some antibodies you have made in response to the vaccine to your baby. When you get a whooping cough vaccine during your pregnancy, you will have antibodies in your breast milk that you can share with your baby as soon as your milk comes in. However, your baby will not get protective antibodies immediately if you wait to get the whooping cough vaccine until after delivering your baby. This is because it takes about 2 weeks for your body to create antibodies. Learn more about the health benefits of breastfeeding.

## 2023-01-21 NOTE — Progress Notes (Signed)
   PRENATAL VISIT NOTE  Subjective:  [name] is a 37 y.o. G4P1021 at [redacted]w[redacted]d being seen today for ongoing prenatal care.  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  Patient reports no complaints.  Contractions: Not present. Vag. Bleeding: None.  Movement: Present. Denies leaking of fluid.   The following portions of the patient's history were reviewed and updated as appropriate: allergies, current medications, past family history, past medical history, past social history, past surgical history and problem list.   Objective:   Vitals:   01/21/23 0819  BP: 109/67  Pulse: 80  Weight: 184 lb 12.8 oz (83.8 kg)    Fetal Status: Fetal Heart Rate (bpm): 159   Movement: Present     General:  Alert, oriented and cooperative. Patient is in no acute distress.  Skin: Skin is warm and dry. No rash noted.   Cardiovascular: Normal heart rate noted  Respiratory: Normal respiratory effort, no problems with respiration noted  Abdomen: Soft, gravid, appropriate for gestational age.  Pain/Pressure: Present     Pelvic: Cervical exam deferred        Extremities: Normal range of motion.  Edema: None  Mental Status: Normal mood and affect. Normal behavior. Normal judgment and thought content.   Assessment and Plan:  Pregnancy: G4P1021 at [redacted]w[redacted]d 1. Multigravida of advanced maternal age in third trimester 2. [redacted] weeks gestation of pregnancy 3. Supervision of high risk pregnancy, antepartum Third trimester labs today.  Declined Tdap. Third trimester expectations reviewed. Preterm labor symptoms and general obstetric precautions including but not limited to vaginal bleeding, contractions, leaking of fluid and  fetal movement were reviewed in detail with the patient. Please refer to After Visit Summary for other counseling recommendations.   Return in about 2 weeks (around 02/04/2023) for OFFICE OB VISIT (MD or APP).  Future Appointments  Date Time Provider Department Center  01/21/2023 10:30 AM [facility] US[facility]    Jaynie Collins, MD

## 2023-01-22 LAB — CBC
Hematocrit: 33.7 % — ABNORMAL LOW (ref 34.0–46.6)
Hemoglobin: 9.7 g/dL — ABNORMAL LOW (ref 11.1–15.9)
MCH: 21.9 pg — ABNORMAL LOW (ref 26.6–33.0)
MCHC: 28.8 g/dL — ABNORMAL LOW (ref 31.5–35.7)
MCV: 76 fL — ABNORMAL LOW (ref 79–97)
Platelets: 174 10*3/uL (ref 150–450)
RBC: 4.42 x10E6/uL (ref 3.77–5.28)
RDW: 15.7 % — ABNORMAL HIGH (ref 11.7–15.4)
WBC: 4.9 10*3/uL (ref 3.4–10.8)

## 2023-01-22 LAB — RPR: RPR Ser Ql: NONREACTIVE

## 2023-01-22 LAB — GLUCOSE TOLERANCE, 2 HOURS W/ 1HR
Glucose, 1 hour: 142 mg/dL (ref 70–179)
Glucose, 2 hour: 118 mg/dL (ref 70–152)
Glucose, Fasting: 85 mg/dL (ref 70–91)

## 2023-01-22 LAB — HIV ANTIBODY (ROUTINE TESTING W REFLEX): HIV Screen 4th Generation wRfx: NONREACTIVE

## 2023-01-25 ENCOUNTER — Other Ambulatory Visit: Payer: Self-pay | Admitting: Obstetrics and Gynecology

## 2023-01-25 ENCOUNTER — Inpatient Hospital Stay (HOSPITAL_COMMUNITY)
Admission: AD | Admit: 2023-01-25 | Discharge: 2023-01-25 | Disposition: A | Discharge status: Home / Self Care | Payer: Medicaid Other | Attending: Obstetrics & Gynecology | Admitting: Obstetrics & Gynecology

## 2023-01-25 ENCOUNTER — Telehealth: Payer: Self-pay

## 2023-01-25 ENCOUNTER — Encounter (HOSPITAL_COMMUNITY): Payer: Self-pay | Admitting: Obstetrics & Gynecology

## 2023-01-25 DIAGNOSIS — Z3A28 28 weeks gestation of pregnancy: Secondary | ICD-10-CM | POA: Insufficient documentation

## 2023-01-25 DIAGNOSIS — E860 Dehydration: Secondary | ICD-10-CM | POA: Diagnosis not present

## 2023-01-25 DIAGNOSIS — Z1152 Encounter for screening for COVID-19: Secondary | ICD-10-CM | POA: Insufficient documentation

## 2023-01-25 DIAGNOSIS — O99013 Anemia complicating pregnancy, third trimester: Secondary | ICD-10-CM

## 2023-01-25 DIAGNOSIS — O2313 Infections of bladder in pregnancy, third trimester: Secondary | ICD-10-CM | POA: Insufficient documentation

## 2023-01-25 DIAGNOSIS — D649 Anemia, unspecified: Secondary | ICD-10-CM | POA: Diagnosis not present

## 2023-01-25 DIAGNOSIS — O99019 Anemia complicating pregnancy, unspecified trimester: Secondary | ICD-10-CM | POA: Insufficient documentation

## 2023-01-25 DIAGNOSIS — R1112 Projectile vomiting: Secondary | ICD-10-CM | POA: Diagnosis present

## 2023-01-25 DIAGNOSIS — M6281 Muscle weakness (generalized): Secondary | ICD-10-CM | POA: Diagnosis present

## 2023-01-25 DIAGNOSIS — N3000 Acute cystitis without hematuria: Secondary | ICD-10-CM | POA: Diagnosis not present

## 2023-01-25 DIAGNOSIS — O99283 Endocrine, nutritional and metabolic diseases complicating pregnancy, third trimester: Secondary | ICD-10-CM | POA: Insufficient documentation

## 2023-01-25 DIAGNOSIS — R509 Fever, unspecified: Secondary | ICD-10-CM | POA: Diagnosis present

## 2023-01-25 LAB — URINALYSIS, ROUTINE W REFLEX MICROSCOPIC
Bilirubin Urine: NEGATIVE
Glucose, UA: NEGATIVE mg/dL
Hgb urine dipstick: NEGATIVE
Ketones, ur: 20 mg/dL — AB
Nitrite: NEGATIVE
Protein, ur: 30 mg/dL — AB
Specific Gravity, Urine: 1.02 (ref 1.005–1.030)
pH: 6 (ref 5.0–8.0)

## 2023-01-25 LAB — RESP PANEL BY RT-PCR (RSV, FLU A&B, COVID)  RVPGX2
Influenza A by PCR: NEGATIVE
Influenza B by PCR: NEGATIVE
Resp Syncytial Virus by PCR: NEGATIVE
SARS Coronavirus 2 by RT PCR: NEGATIVE

## 2023-01-25 LAB — ANEMIA PROFILE B
Basophils Absolute: 0 10*3/uL (ref 0.0–0.2)
Basos: 1 %
EOS (ABSOLUTE): 0.1 10*3/uL (ref 0.0–0.4)
Eos: 2 %
Ferritin: 8 ng/mL — ABNORMAL LOW (ref 15–150)
Folate: 9.2 ng/mL (ref >3.0)
Hematocrit: 34.2 % (ref 34.0–46.6)
Hemoglobin: 9.7 g/dL — ABNORMAL LOW (ref 11.1–15.9)
Immature Grans (Abs): 0 10*3/uL (ref 0.0–0.1)
Immature Granulocytes: 0 %
Iron Saturation: 4 % — CL (ref 15–55)
Iron: 25 ug/dL — ABNORMAL LOW (ref 27–159)
Lymphocytes Absolute: 1.3 10*3/uL (ref 0.7–3.1)
Lymphs: 25 %
MCH: 22.1 pg — ABNORMAL LOW (ref 26.6–33.0)
MCHC: 28.4 g/dL — ABNORMAL LOW (ref 31.5–35.7)
MCV: 78 fL — ABNORMAL LOW (ref 79–97)
Monocytes Absolute: 0.5 10*3/uL (ref 0.1–0.9)
Monocytes: 10 %
Neutrophils Absolute: 3.1 10*3/uL (ref 1.4–7.0)
Neutrophils: 62 %
Platelets: 183 10*3/uL (ref 150–450)
RBC: 4.38 x10E6/uL (ref 3.77–5.28)
RDW: 15.9 % — ABNORMAL HIGH (ref 11.7–15.4)
Retic Ct Pct: 1.8 % (ref 0.6–2.6)
Total Iron Binding Capacity: 565 ug/dL (ref 250–450)
UIBC: 540 ug/dL — ABNORMAL HIGH (ref 131–425)
Vitamin B-12: 238 pg/mL (ref 232–1245)
WBC: 5.1 10*3/uL (ref 3.4–10.8)

## 2023-01-25 LAB — COMPREHENSIVE METABOLIC PANEL
ALT: 13 U/L (ref 0–44)
AST: 16 U/L (ref 15–41)
Albumin: 2.7 g/dL — ABNORMAL LOW (ref 3.5–5.0)
Alkaline Phosphatase: 64 U/L (ref 38–126)
Anion gap: 12 (ref 5–15)
BUN: 7 mg/dL (ref 6–20)
CO2: 20 mmol/L — ABNORMAL LOW (ref 22–32)
Calcium: 9.1 mg/dL (ref 8.9–10.3)
Chloride: 101 mmol/L (ref 98–111)
Creatinine, Ser: 0.75 mg/dL (ref 0.44–1.00)
GFR, Estimated: >60 mL/min (ref >60)
Glucose, Bld: 102 mg/dL — ABNORMAL HIGH (ref 70–99)
Potassium: 3.7 mmol/L (ref 3.5–5.1)
Sodium: 133 mmol/L — ABNORMAL LOW (ref 135–145)
Total Bilirubin: 0.6 mg/dL (ref 0.3–1.2)
Total Protein: 5.9 g/dL — ABNORMAL LOW (ref 6.5–8.1)

## 2023-01-25 LAB — CBC
HCT: 28.3 % — ABNORMAL LOW (ref 36.0–46.0)
Hemoglobin: 8.3 g/dL — ABNORMAL LOW (ref 12.0–15.0)
MCH: 21.7 pg — ABNORMAL LOW (ref 26.0–34.0)
MCHC: 29.3 g/dL — ABNORMAL LOW (ref 30.0–36.0)
MCV: 73.9 fL — ABNORMAL LOW (ref 80.0–100.0)
Platelets: 145 10*3/uL — ABNORMAL LOW (ref 150–400)
RBC: 3.83 MIL/uL — ABNORMAL LOW (ref 3.87–5.11)
RDW: 16.1 % — ABNORMAL HIGH (ref 11.5–15.5)
WBC: 4.9 10*3/uL (ref 4.0–10.5)
nRBC: 0 % (ref 0.0–0.2)

## 2023-01-25 LAB — SPECIMEN STATUS REPORT

## 2023-01-25 LAB — FERRITIN: Ferritin: 4 ng/mL — ABNORMAL LOW (ref 11–307)

## 2023-01-25 LAB — MAGNESIUM: Magnesium: 1.8 mg/dL (ref 1.7–2.4)

## 2023-01-25 MED ORDER — NITROFURANTOIN MONOHYD MACRO 100 MG PO CAPS: 100.0000 mg | ORAL_CAPSULE | Freq: Two times a day (BID) | ORAL | 0 refills | Status: DC

## 2023-01-25 MED ORDER — NITROFURANTOIN MONOHYD MACRO 100 MG PO CAPS
100.0000 mg | ORAL_CAPSULE | Freq: Two times a day (BID) | ORAL | Status: DC
  Administered 2023-01-25: 100 mg via ORAL
  Filled 2023-01-25: qty 1

## 2023-01-25 MED ORDER — LACTATED RINGERS IV BOLUS
1000.0000 mL | Freq: Once | INTRAVENOUS | Status: AC
  Administered 2023-01-25: 1000 mL via INTRAVENOUS

## 2023-01-25 NOTE — Telephone Encounter (Signed)
Auth Submission: NO AUTH NEEDED Site of care: Site of care: [facility] Payer: Medicaid [employer] Medication & CPT/J Code(s) submitted: Venofer (Iron Sucrose) J1756 Route of submission (phone, fax, portal):  Phone # Fax # Auth type: Buy/Bill PB Units/visits requested: 200mg  x 5 doses Reference number:  Approval from: 01/25/23 to 05/07/23

## 2023-01-25 NOTE — MAU Note (Addendum)
.  [name] is a 37 y.o. at [redacted]w[redacted]d here in MAU reporting: Bilateral arm and leg aches that began this morning as well as fatigue. She reports she went to work [employer]) and began blow drying a patients hair and reports it was hard to lift her arms. She reports she was then trying to braid hair and her arm pain was too intense. She reports she began feeling hot. Reports she drank water and sat down then got up to vomit. She reports she went outside and felt as if she could not catch her breath and began shaking. She reports she put herself on the ground and EMS was called. Denies VB or LOF. +FM.   -CBG on EMS 163. Ate breakfast and orange juice around 1000. -She reports she has not felt well since her glucose test this past Monday. -Reports no one around her is sick.  Onset of complaint: This morning Pain score: 8/10 arms and legs  FHT: 140 initial external Lab orders placed from triage: UA, COVID

## 2023-01-25 NOTE — MAU Provider Note (Signed)
Chief Complaint: Generalized Body Aches, Shortness of Breath, and Fatigue   Event Date/Time   First Provider Initiated Contact with Patient 01/25/23 1252      SUBJECTIVE HPI: [name] is a 37 y.o. G4P1021 at [redacted]w[redacted]d by early ultrasound who presents to maternity admissions reporting body aches and fatigue.  Notes she took her GTT on Monday. Felt nauseous and not like herself since. Has been unable to keep much food/liquid down. Woke up this morning feeling achey "like yesterday was leg day". 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 Brought in by EMS. +SOB. Denies fever, diarrhea, congestion, sick contacts.  +FM. She denies vaginal bleeding, vaginal itching/burning, urinary symptoms, h/a.    HPI  Past Medical History:  Diagnosis Date   Medical history non-contributory    Past Surgical History:  Procedure Laterality Date   DILATION AND CURETTAGE OF UTERUS     Social History   Socioeconomic History   Marital status: Single    Spouse name: Not on file   Number of children: Not on file   Years of education: Not on file   Highest education level: Not on file  Occupational History   Not on file  Tobacco Use   Smoking status: Never   Smokeless tobacco: Never  Vaping Use   Vaping status: Never Used  Substance and Sexual Activity   Alcohol use: Not Currently    Comment: Socially   Drug use: No   Sexual activity: Yes    Birth control/protection: Implant    Comment: implanon placed 2010  Other Topics Concern   Not on file  Social History Narrative   Not on file   Social Determinants of Health   Financial Resource Strain: Not on file  Food Insecurity: Not on file  Transportation Needs: Not on file  Physical Activity: Not on file  Stress: Not on file  Social Connections: Not on file  Intimate Partner Violence: Not on file   No current facility-administered medications on file prior to encounter.   Current Outpatient Medications on File Prior  to Encounter  Medication Sig Dispense Refill   Blood Pressure Monitoring (BLOOD PRESSURE KIT) DEVI 1 each by Does not apply route as needed. (Patient not taking: Reported on 12/28/2022) 1 each 0   cephALEXin (KEFLEX) 500 MG capsule Take 500 mg by mouth daily. (Patient not taking: Reported on 11/29/2022)     ECHINACEA PO Take 1 tablet by mouth daily. (Patient not taking: Reported on 01/21/2023)     ELDERBERRY PO Take 1 tablet by mouth daily. (Patient not taking: Reported on 11/29/2022)     ferrous sulfate 325 (65 FE) MG tablet Take 1 tablet (325 mg total) by mouth daily. 30 tablet 0   ibuprofen (ADVIL,MOTRIN) 800 MG tablet Take 800 mg by mouth every 8 (eight) hours as needed for fever or headache. (Patient not taking: Reported on 11/29/2022)     lactobacillus acidophilus (BACID) TABS tablet Take 2 tablets by mouth 3 (three) times daily. (Patient not taking: Reported on 11/30/2022)     metroNIDAZOLE (FLAGYL) 500 MG tablet Take 1 tablet (500 mg total) by mouth 2 (two) times daily. (Patient not taking: Reported on 12/28/2022) 14 tablet 0   oseltamivir (TAMIFLU) 75 MG capsule Take 1 capsule (75 mg total) by mouth every 12 (twelve) hours. (Patient not taking: Reported on 11/29/2022) 10 capsule 0   Prenatal Vit-Fe Fumarate-FA (MULTIVITAMIN-PRENATAL) 27-0.8 MG TABS tablet Take 1 tablet by mouth daily at 12 noon.  SPIRULINA PO Take 1 tablet by mouth daily. (Patient not taking: Reported on 11/29/2022)     TURMERIC PO Take 1 tablet by mouth daily. (Patient not taking: Reported on 11/29/2022)     Allergies  Allergen Reactions   Sulfa Antibiotics Hives and Swelling   Tomato Other (See Comments)    "Lips and throat itch and swell"    ROS:  Pertinent positives/negatives listed above.  I have reviewed patient's Past Medical Hx, Surgical Hx, Family Hx, Social Hx, medications and allergies.   Physical Exam  Patient Vitals for the past 24 hrs:  BP Temp Temp src Pulse Resp SpO2  01/25/23 1213 100/71 97.9 F (36.6  C) Oral 95 16 99 %   Constitutional: Well-developed, well-nourished female in no acute distress. Appears fatigued Cardiovascular: normal rate/rhythm. No m/r/g Respiratory: normal effort, CTAB GI: Abd soft, non-tender MS: Extremities nontender, no edema, normal ROM Neurologic: Alert and oriented x 4.   FHT:  Baseline 135 , moderate variability, accelerations (10x10) present, no decelerations Contractions:none  LAB RESULTS Results for orders placed or performed during the hospital encounter of 01/25/23 (from the past 24 hour(s))  Urinalysis, Routine w reflex microscopic -Urine, Clean Catch     Status: Abnormal   Collection Time: 01/25/23 12:18 PM  Result Value Ref Range   Color, Urine YELLOW YELLOW   APPearance CLOUDY (A) CLEAR   Specific Gravity, Urine 1.020 1.005 - 1.030   pH 6.0 5.0 - 8.0   Glucose, UA NEGATIVE NEGATIVE mg/dL   Hgb urine dipstick NEGATIVE NEGATIVE   Bilirubin Urine NEGATIVE NEGATIVE   Ketones, ur 20 (A) NEGATIVE mg/dL   Protein, ur 30 (A) NEGATIVE mg/dL   Nitrite NEGATIVE NEGATIVE   Leukocytes,Ua MODERATE (A) NEGATIVE   RBC / HPF 0-5 0 - 5 RBC/hpf   WBC, UA 11-20 0 - 5 WBC/hpf   Bacteria, UA FEW (A) NONE SEEN   Squamous Epithelial / HPF 11-20 0 - 5 /HPF   Mucus PRESENT   Resp panel by RT-PCR (RSV, Flu A&B, Covid) Anterior Nasal Swab     Status: None   Collection Time: 01/25/23 12:39 PM   Specimen: Anterior Nasal Swab  Result Value Ref Range   SARS Coronavirus 2 by RT PCR NEGATIVE NEGATIVE   Influenza A by PCR NEGATIVE NEGATIVE   Influenza B by PCR NEGATIVE NEGATIVE   Resp Syncytial Virus by PCR NEGATIVE NEGATIVE  CBC     Status: Abnormal   Collection Time: 01/25/23  1:11 PM  Result Value Ref Range   WBC 4.9 4.0 - 10.5 K/uL   RBC 3.83 (L) 3.87 - 5.11 MIL/uL   Hemoglobin 8.3 (L) 12.0 - 15.0 g/dL   HCT 16.1 (L) 09.6 - 04.5 %   MCV 73.9 (L) 80.0 - 100.0 fL   MCH 21.7 (L) 26.0 - 34.0 pg   MCHC 29.3 (L) 30.0 - 36.0 g/dL   RDW 40.9 (H) 81.1 - 91.4 %    Platelets 145 (L) 150 - 400 K/uL   nRBC 0.0 0.0 - 0.2 %  Comprehensive metabolic panel     Status: Abnormal   Collection Time: 01/25/23  1:11 PM  Result Value Ref Range   Sodium 133 (L) 135 - 145 mmol/L   Potassium 3.7 3.5 - 5.1 mmol/L   Chloride 101 98 - 111 mmol/L   CO2 20 (L) 22 - 32 mmol/L   Glucose, Bld 102 (H) 70 - 99 mg/dL   BUN 7 6 - 20 mg/dL   Creatinine, Ser  0.75 0.44 - 1.00 mg/dL   Calcium 9.1 8.9 - 16.1 mg/dL   Total Protein 5.9 (L) 6.5 - 8.1 g/dL   Albumin 2.7 (L) 3.5 - 5.0 g/dL   AST 16 15 - 41 U/L   ALT 13 0 - 44 U/L   Alkaline Phosphatase 64 38 - 126 U/L   Total Bilirubin 0.6 0.3 - 1.2 mg/dL   GFR, Estimated >09 >60 mL/min   Anion gap 12 5 - 15  Magnesium     Status: None   Collection Time: 01/25/23  1:11 PM  Result Value Ref Range   Magnesium 1.8 1.7 - 2.4 mg/dL    O/Positive/-- (45/40 0917)  IMAGING [place] MFM OB FOLLOW UP  Result Date: 01/21/2023 ----------------------------------------------------------------------  OBSTETRICS REPORT                       (Signed Final 01/21/2023 11:15 am) ---------------------------------------------------------------------- Patient Info  ID #:       [id number]                          D.O.B.:  19-Sep-1985 (36 yrs)  Name:       Jannetta Quint                 Visit Date: 01/21/2023 07:35 am ---------------------------------------------------------------------- Performed By  Attending:        Noralee Space MD        Ref. Address:     [facility]  Performed By:     Isac Sarna        [facility]  Referred By:      Carlynn Herald CNM ---------------------------------------------------------------------- Orders  #  Description                           Code        Ordered By  1  [place] MFM OB  FOLLOW UP                   29562.13    Braxton Feathers ----------------------------------------------------------------------  #  Order #  Accession #                Episode #  1  [id number]                   [id number]                 [id number] ---------------------------------------------------------------------- Indications  Advanced maternal age multigravida 84+,        O41.523  third trimester  (36)  Uterine fibroids affecting pregnancy in third  O34.13, D25.9  trimester, antepartum  Late to prenatal care, third trimester         O09.33  Encounter for other antenatal screening        Z36.2  follow-up  AFP Pending, Declined NIPS  [redacted] weeks gestation of pregnancy                Z3A.28 ---------------------------------------------------------------------- Fetal Evaluation  Num Of Fetuses:         1  Fetal Heart Rate(bpm):  131  Cardiac Activity:       Observed  Presentation:           Cephalic  Placenta:               Posterior Fundal  P. Cord Insertion:      Previously seen  Amniotic Fluid  AFI FV:      Within normal limits  AFI Sum(cm)     %Tile       Largest Pocket(cm)  16.48           60          5.47  RUQ(cm)       RLQ(cm)       LUQ(cm)        LLQ(cm)  4.34          2.24          5.47           4.43 ---------------------------------------------------------------------- Biometry  BPD:      75.7  mm     G. Age:  30w 3d         93  %    CI:        71.88   %    70 - 86                                                          FL/HC:      18.8   %    18.8 - 20.6  HC:      284.2  mm     G. Age:  31w 1d         94  %    HC/AC:      1.12        1.05 - 1.21  AC:      254.2  mm     G. Age:  29w 4d         81  %    FL/BPD:     70.7   %    71 - 87  FL:       53.5  mm     G. Age:  28w 3d         37  %    FL/AC:  21.0   %    20 - 24  Est. FW:    1389  gm      3 lb 1 oz     80  % ---------------------------------------------------------------------- OB History  Gravidity:    4         Term:   1          SAB:   1  TOP:          1        Living:  1 ---------------------------------------------------------------------- Gestational Age  LMP:           28w 5d        Date:  07/04/22                  EDD:   04/10/23  U/S Today:     29w 6d                                        EDD:   04/02/23  Best:          Eden Emms 2d     Det. ByMarcella Dubs         EDD:   04/13/23                                      (08/20/22) ---------------------------------------------------------------------- Anatomy  Cranium:               Appears normal         Aortic Arch:            Appears normal  Cavum:                 Appears normal         Ductal Arch:            Appears normal  Ventricles:            Appears normal         Diaphragm:              Appears normal  Choroid Plexus:        Previously seen        Stomach:                Appears normal, left                                                                        sided  Cerebellum:            Previously seen        Abdomen:                Previously seen  Posterior Fossa:       Previously seen        Abdominal Wall:         Previously seen  Nuchal Fold:           Previously seen        Cord Vessels:  Previously seen  Face:                  Appears normal         Kidneys:                Appear normal                         (orbits and profile)  Lips:                  Previously seen        Bladder:                Appears normal  Thoracic:              Appears normal         Spine:                  Previously seen  Heart:                 Previously seen        Upper Extremities:      Previously seen  RVOT:                  Appears normal         Lower Extremities:      Previously seen  LVOT:                  Appears normal  Other:  Heels/feet and open hands/5th digits, Nasal bone, lenses, maxilla,          mandible, falx, VC, 3VV and 3VTV prev visualized. Fetus appears to          be a female prev. ---------------------------------------------------------------------- Myomas   Site                     L(cm)      W(cm)      D(cm)       Location  Anterior LT              4.74       2.55       3.8         Intramural ----------------------------------------------------------------------  Blood Flow                  RI       PI       Comments  Yes                                           Visualized ---------------------------------------------------------------------- Impression  Fetal growth is appropriate for gestational age .Amniotic fluid  is normal and good fetal activity is seen .  Anterior myoma is seen (measurements above). ---------------------------------------------------------------------- Recommendations  -No follow-up appointments were made . ----------------------------------------------------------------------                 Noralee Space, MD Electronically Signed Final Report   01/21/2023 11:15 am ----------------------------------------------------------------------    MAU Management/MDM: Orders Placed This Encounter  Procedures   Resp panel by RT-PCR (RSV, Flu A&B, Covid) Anterior Nasal Swab   Culture, OB Urine   Urinalysis, Routine w reflex microscopic -Urine, Clean Catch   CBC   Comprehensive metabolic panel   Magnesium   Ferritin   Discharge patient  Meds ordered this encounter  Medications   lactated ringers bolus 1,000 mL   nitrofurantoin (macrocrystal-monohydrate) (MACROBID) capsule 100 mg   nitrofurantoin, macrocrystal-monohydrate, (MACROBID) 100 MG capsule    Sig: Take 1 capsule (100 mg total) by mouth every 12 (twelve) hours.    Dispense:  9 capsule    Refill:  0    Concern for viral infection, UTI, dehydration based upon presentation. Patient given 1 L LR. Patient declined any medications for body aches and nausea. UA, labs, and Covid/flu/RSV swab obtained. Urine culture added on.  Labs returned remarkable for UTI, anemia with Hgb 8.3 which is worse from last draw. Is taking PO iron. Will treat UTI with Macrobid and get set up for IV iron  infusions in the outpatient setting.  Reassuring FHT.  1432: Patient felt improved with above interventions. Given dose of macrobid prior to discharge. Additionally will place IV iron infusion orders for outpatient setting.  ASSESSMENT 1. Acute cystitis without hematuria   2. Anemia affecting pregnancy in third trimester   3. Dehydration   4. [redacted] weeks gestation of pregnancy     PLAN Discharge home with strict return precautions. Allergies as of 01/25/2023       Reactions   Sulfa Antibiotics Hives, Swelling   Tomato Other (See Comments)   "Lips and throat itch and swell"        Medication List     STOP taking these medications    Blood Pressure Kit Devi   cephALEXin 500 MG capsule Commonly known as: KEFLEX   ECHINACEA PO   ELDERBERRY PO   ibuprofen 800 MG tablet Commonly known as: ADVIL   lactobacillus acidophilus Tabs tablet   metroNIDAZOLE 500 MG tablet Commonly known as: FLAGYL   oseltamivir 75 MG capsule Commonly known as: TAMIFLU   SPIRULINA PO   TURMERIC PO       TAKE these medications    ferrous sulfate 325 (65 FE) MG tablet Take 1 tablet (325 mg total) by mouth daily.   multivitamin-prenatal 27-0.8 MG Tabs tablet Take 1 tablet by mouth daily at 12 noon.   nitrofurantoin (macrocrystal-monohydrate) 100 MG capsule Commonly known as: MACROBID Take 1 capsule (100 mg total) by mouth every 12 (twelve) hours.         Wylene Simmer, MD OB Fellow 01/25/2023  2:36 PM

## 2023-01-26 LAB — CULTURE, OB URINE: Culture: NO GROWTH

## 2023-01-29 ENCOUNTER — Other Ambulatory Visit: Payer: Self-pay | Admitting: Family Medicine

## 2023-02-05 ENCOUNTER — Other Ambulatory Visit: Payer: Self-pay

## 2023-02-05 ENCOUNTER — Ambulatory Visit (INDEPENDENT_AMBULATORY_CARE_PROVIDER_SITE_OTHER): Payer: Medicaid Other | Admitting: Certified Nurse Midwife

## 2023-02-05 ENCOUNTER — Encounter: Payer: Self-pay | Admitting: Certified Nurse Midwife

## 2023-02-05 VITALS — BP 120/78 | HR 86 | Wt 189.0 lb

## 2023-02-05 DIAGNOSIS — O99013 Anemia complicating pregnancy, third trimester: Secondary | ICD-10-CM

## 2023-02-05 DIAGNOSIS — O09522 Supervision of elderly multigravida, second trimester: Secondary | ICD-10-CM

## 2023-02-05 DIAGNOSIS — Z3493 Encounter for supervision of normal pregnancy, unspecified, third trimester: Secondary | ICD-10-CM

## 2023-02-05 DIAGNOSIS — O09523 Supervision of elderly multigravida, third trimester: Secondary | ICD-10-CM

## 2023-02-05 DIAGNOSIS — D649 Anemia, unspecified: Secondary | ICD-10-CM

## 2023-02-05 DIAGNOSIS — Z3A30 30 weeks gestation of pregnancy: Secondary | ICD-10-CM

## 2023-02-05 NOTE — Progress Notes (Signed)
   PRENATAL VISIT NOTE  Subjective:  [name] is a 37 y.o. G4P1021 at [redacted]w[redacted]d being seen today for ongoing prenatal care.  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  Patient reports no complaints.  Contractions: Not present. Vag. Bleeding: None.  Movement: Present. Denies leaking of fluid.   The following portions of the patient's history were reviewed and updated as appropriate: allergies, current medications, past family history, past medical history, past social history, past surgical history and problem list.   Objective:   Vitals:   02/05/23 0953  BP: 120/78  Pulse: 86  Weight: 189 lb (85.7 kg)    Fetal Status: Fetal Heart Rate (bpm): 136 Fundal Height: 29 cm Movement: Present     General:  Alert, oriented and cooperative. Patient is in no acute distress.  Skin: Skin is warm and dry. No rash noted.   Cardiovascular: Normal heart rate noted  Respiratory: Normal respiratory effort, no problems with respiration noted  Abdomen: Soft, gravid, appropriate for gestational age.  Pain/Pressure: Present     Pelvic: Cervical exam deferred        Extremities: Normal range of motion.  Edema: None  Mental Status: Normal mood and affect. Normal behavior. Normal judgment and thought content.   Assessment and Plan:  Pregnancy: G4P1021 at [redacted]w[redacted]d 1. Multigravida of advanced maternal age in second trimester - Patient overall doing well.  - Patient reports vigorous and frequent fetal movement.   2. Encounter for supervision of low-risk pregnancy in third trimester - CBC; Future - Anemia Profile B; Future  3. [redacted] weeks gestation of pregnancy - Reviewed Normal GTT  results.   5. Anemia, unspecified type - Patient had an ED visit r/t anemia in pregnancy, Was recommended IV iron and patient declined.  - Patient still declines on todays visit. Reviewed risks and benefits of IV infusion.  - Recommended that patient take iron supplement daily x1 week and recollect Labs next week. If remains low patient is amendable IV Fe. If up to baseline, patient will continue with PO Fe tincture.   - S. Warren-Hill, CNM also advised patient on herbal supplements that are safe in pregnancy that may also increase absorption and iron levels.   - CBC; Future - Anemia Profile B; Future  Preterm labor symptoms and general obstetric precautions including but not limited to vaginal bleeding, contractions, leaking of fluid and fetal movement were reviewed in detail with the patient. Please refer to After Visit Summary for other counseling recommendations.   Return in about 1 week (around 02/12/2023), or Lab draw; 2 weeks for LOB.  Future Appointments  Date Time Provider Department Center  02/12/2023 10:30 AM [facility]  02/22/2023 10:15 AM Carlynn Herald, CNM [facility]  03/07/2023  1:15 PM Sue Lush, FNP [facility]    Shiri Hodapp Danella Deis) Suzie Portela, MSN, CNM  Center for [facility]  02/05/2023 1:10 PM

## 2023-02-12 ENCOUNTER — Other Ambulatory Visit: Payer: Medicaid Other

## 2023-02-22 ENCOUNTER — Encounter: Payer: Medicaid Other | Admitting: Certified Nurse Midwife

## 2023-02-22 DIAGNOSIS — O99013 Anemia complicating pregnancy, third trimester: Secondary | ICD-10-CM

## 2023-02-22 DIAGNOSIS — Z3493 Encounter for supervision of normal pregnancy, unspecified, third trimester: Secondary | ICD-10-CM

## 2023-02-22 DIAGNOSIS — Z3A23 23 weeks gestation of pregnancy: Secondary | ICD-10-CM

## 2023-03-07 ENCOUNTER — Ambulatory Visit (INDEPENDENT_AMBULATORY_CARE_PROVIDER_SITE_OTHER): Payer: Medicaid Other | Admitting: Obstetrics and Gynecology

## 2023-03-07 ENCOUNTER — Other Ambulatory Visit: Payer: Self-pay

## 2023-03-07 VITALS — BP 127/68 | HR 83 | Wt 199.0 lb

## 2023-03-07 DIAGNOSIS — Z3A34 34 weeks gestation of pregnancy: Secondary | ICD-10-CM

## 2023-03-07 DIAGNOSIS — O09523 Supervision of elderly multigravida, third trimester: Secondary | ICD-10-CM

## 2023-03-07 DIAGNOSIS — D649 Anemia, unspecified: Secondary | ICD-10-CM

## 2023-03-07 DIAGNOSIS — O99013 Anemia complicating pregnancy, third trimester: Secondary | ICD-10-CM

## 2023-03-07 DIAGNOSIS — Z3493 Encounter for supervision of normal pregnancy, unspecified, third trimester: Secondary | ICD-10-CM

## 2023-03-07 DIAGNOSIS — E538 Deficiency of other specified B group vitamins: Secondary | ICD-10-CM

## 2023-03-07 DIAGNOSIS — O0993 Supervision of high risk pregnancy, unspecified, third trimester: Secondary | ICD-10-CM

## 2023-03-07 DIAGNOSIS — O09522 Supervision of elderly multigravida, second trimester: Secondary | ICD-10-CM

## 2023-03-07 DIAGNOSIS — O0990 Supervision of high risk pregnancy, unspecified, unspecified trimester: Secondary | ICD-10-CM

## 2023-03-07 NOTE — Patient Instructions (Signed)

## 2023-03-07 NOTE — Progress Notes (Signed)
   PRENATAL VISIT NOTE  Subjective:  [name] is a 37 y.o. [id number] at [redacted]w[redacted]d being seen today for ongoing prenatal care.  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  Patient reports no complaints.  Contractions: Not present. Vag. Bleeding: None.  Movement: Present. Denies leaking of fluid.   The following portions of the patient's history were reviewed and updated as appropriate: allergies, current medications, past family history, past medical history, past social history, past surgical history and problem list.   Objective:   Vitals:   03/07/23 1337  BP: 127/68  Pulse: 83  Weight: 199 lb (90.3 kg)    Fetal Status: Fetal Heart Rate (bpm): 144   Movement: Present     General:  Alert, oriented and cooperative. Patient is in no acute distress.  Skin: Skin is warm and dry. No rash noted.   Cardiovascular: Normal heart rate noted  Respiratory: Normal respiratory effort, no problems with respiration noted  Abdomen: Soft, gravid, appropriate for gestational age.  Pain/Pressure: Present     Pelvic: Cervical exam deferred        Extremities: Normal range of motion.     Mental Status: Normal mood and affect. Normal behavior. Normal judgment and thought content.   Assessment and Plan:  Pregnancy: G4P1021 at [redacted]w[redacted]d 1. Encounter for supervision of low-risk pregnancy in third trimester BP and FHR normal Doing well, feeling regular movement    2. Multigravida of advanced maternal age in second trimester U/s 9/16 normal, no follow up needed  3. Anemia affecting pregnancy in third trimester Had discussion last visit about iv iron, agreed to take oral iron x one week  and recollect labs at this visit. Has been taking liquid iron, and then switched to oral iron   4. [redacted] weeks gestation of pregnancy Anticipatory guidance regarding swabs next appt   Preterm labor symptoms and general obstetric precautions including but not limited to vaginal bleeding, contractions, leaking of fluid and fetal movement were reviewed in detail with the patient. Please refer to After Visit Summary for other counseling recommendations.   Return in about 2 weeks (around 03/21/2023) for OB VISIT (MD or APP). Future Appointments  Date Time Provider Department Center  03/21/2023  1:35 PM Milas Hock, MD [facility]     Albertine Grates, FNP

## 2023-03-08 LAB — ANEMIA PROFILE B
Basophils Absolute: 0 10*3/uL (ref 0.0–0.2)
Basos: 0 %
EOS (ABSOLUTE): 0 10*3/uL (ref 0.0–0.4)
Eos: 1 %
Ferritin: 8 ng/mL — ABNORMAL LOW (ref 15–150)
Folate: 6.6 ng/mL (ref >3.0)
Hematocrit: 28.5 % — ABNORMAL LOW (ref 34.0–46.6)
Hemoglobin: 8.6 g/dL — ABNORMAL LOW (ref 11.1–15.9)
Immature Grans (Abs): 0 10*3/uL (ref 0.0–0.1)
Immature Granulocytes: 1 %
Iron Saturation: 5 % — CL (ref 15–55)
Iron: 26 ug/dL — ABNORMAL LOW (ref 27–159)
Lymphocytes Absolute: 1.1 10*3/uL (ref 0.7–3.1)
Lymphs: 24 %
MCH: 22.5 pg — ABNORMAL LOW (ref 26.6–33.0)
MCHC: 30.2 g/dL — ABNORMAL LOW (ref 31.5–35.7)
MCV: 74 fL — ABNORMAL LOW (ref 79–97)
Monocytes Absolute: 0.6 10*3/uL (ref 0.1–0.9)
Monocytes: 14 %
Neutrophils Absolute: 2.8 10*3/uL (ref 1.4–7.0)
Neutrophils: 60 %
Platelets: 178 10*3/uL (ref 150–450)
RBC: 3.83 x10E6/uL (ref 3.77–5.28)
RDW: 18.5 % — ABNORMAL HIGH (ref 11.7–15.4)
Retic Ct Pct: 2.2 % (ref 0.6–2.6)
Total Iron Binding Capacity: 512 ug/dL — ABNORMAL HIGH (ref 250–450)
UIBC: 486 ug/dL — ABNORMAL HIGH (ref 131–425)
Vitamin B-12: 174 pg/mL — ABNORMAL LOW (ref 232–1245)
WBC: 4.6 10*3/uL (ref 3.4–10.8)

## 2023-03-13 ENCOUNTER — Encounter: Payer: Self-pay | Admitting: Obstetrics and Gynecology

## 2023-03-13 ENCOUNTER — Telehealth: Payer: Self-pay

## 2023-03-13 MED ORDER — CYANOCOBALAMIN 1000 MCG/ML IJ SOLN: 1000.0000 ug | Freq: Once | INTRAMUSCULAR | 0 refills | Status: AC

## 2023-03-13 NOTE — Addendum Note (Signed)
Addended by: Ihor Austin D on: 03/13/2023 02:18 PM   Modules accepted: Orders

## 2023-03-13 NOTE — Telephone Encounter (Signed)
[name], patient will be scheduled as soon as possible.  Auth Submission: NO AUTH NEEDED Site of care: Site of care: [facility] Payer: [employer] medicaid Medication & CPT/J Code(s) submitted: Venofer (Iron Sucrose) J1756 Route of submission (phone, fax, portal):  Phone # Fax # Auth type: Buy/Bill PB Units/visits requested: 200mg  x 3 doses Reference number:  Approval from: 03/13/23 to 05/07/23

## 2023-03-13 NOTE — Addendum Note (Signed)
Addended by: Sue Lush on: 03/13/2023 01:33 PM   Modules accepted: Orders

## 2023-03-21 ENCOUNTER — Encounter: Payer: Medicaid Other | Admitting: Obstetrics and Gynecology

## 2023-03-23 ENCOUNTER — Inpatient Hospital Stay (HOSPITAL_COMMUNITY)
Admission: AD | Admit: 2023-03-23 | Discharge: 2023-03-25 | DRG: 807 | Disposition: A | Discharge status: Home / Self Care | Payer: Medicaid Other | Attending: Family Medicine | Admitting: Family Medicine

## 2023-03-23 ENCOUNTER — Encounter: Payer: Self-pay | Admitting: Certified Nurse Midwife

## 2023-03-23 ENCOUNTER — Encounter (HOSPITAL_COMMUNITY): Payer: Self-pay | Admitting: Obstetrics and Gynecology

## 2023-03-23 ENCOUNTER — Other Ambulatory Visit: Payer: Self-pay

## 2023-03-23 DIAGNOSIS — Z882 Allergy status to sulfonamides status: Secondary | ICD-10-CM

## 2023-03-23 DIAGNOSIS — Z8249 Family history of ischemic heart disease and other diseases of the circulatory system: Secondary | ICD-10-CM | POA: Diagnosis not present

## 2023-03-23 DIAGNOSIS — O1405 Mild to moderate pre-eclampsia, complicating the puerperium: Secondary | ICD-10-CM | POA: Diagnosis not present

## 2023-03-23 DIAGNOSIS — O09523 Supervision of elderly multigravida, third trimester: Secondary | ICD-10-CM | POA: Diagnosis not present

## 2023-03-23 DIAGNOSIS — Z148 Genetic carrier of other disease: Secondary | ICD-10-CM

## 2023-03-23 DIAGNOSIS — Z3A37 37 weeks gestation of pregnancy: Secondary | ICD-10-CM

## 2023-03-23 DIAGNOSIS — O9902 Anemia complicating childbirth: Secondary | ICD-10-CM | POA: Diagnosis present

## 2023-03-23 DIAGNOSIS — O26893 Other specified pregnancy related conditions, third trimester: Secondary | ICD-10-CM | POA: Diagnosis present

## 2023-03-23 DIAGNOSIS — Z833 Family history of diabetes mellitus: Secondary | ICD-10-CM | POA: Diagnosis not present

## 2023-03-23 DIAGNOSIS — R0789 Other chest pain: Secondary | ICD-10-CM | POA: Diagnosis not present

## 2023-03-23 DIAGNOSIS — Z825 Family history of asthma and other chronic lower respiratory diseases: Secondary | ICD-10-CM

## 2023-03-23 DIAGNOSIS — O4292 Full-term premature rupture of membranes, unspecified as to length of time between rupture and onset of labor: Principal | ICD-10-CM | POA: Diagnosis present

## 2023-03-23 DIAGNOSIS — O4202 Full-term premature rupture of membranes, onset of labor within 24 hours of rupture: Secondary | ICD-10-CM | POA: Diagnosis not present

## 2023-03-23 LAB — CBC
HCT: 25.3 % — ABNORMAL LOW (ref 36.0–46.0)
Hemoglobin: 9.1 g/dL — ABNORMAL LOW (ref 12.0–15.0)
MCH: 30.7 pg (ref 26.0–34.0)
MCHC: 36 g/dL (ref 30.0–36.0)
MCV: 85.5 fL (ref 80.0–100.0)
Platelets: 158 10*3/uL (ref 150–400)
RBC: 2.96 MIL/uL — ABNORMAL LOW (ref 3.87–5.11)
RDW: 25.2 % — ABNORMAL HIGH (ref 11.5–15.5)
WBC: 6.7 10*3/uL (ref 4.0–10.5)
nRBC: 0.4 % — ABNORMAL HIGH (ref 0.0–0.2)

## 2023-03-23 LAB — TYPE AND SCREEN
ABO/RH(D): O POS
Antibody Screen: NEGATIVE

## 2023-03-23 MED ORDER — TRANEXAMIC ACID-NACL 1000-0.7 MG/100ML-% IV SOLN: 1000.0000 mg | INTRAVENOUS | Status: DC

## 2023-03-23 MED ORDER — OXYTOCIN BOLUS FROM INFUSION
333.0000 mL | Freq: Once | INTRAVENOUS | Status: AC
  Administered 2023-03-23: 333 mL via INTRAVENOUS

## 2023-03-23 MED ORDER — SOD CITRATE-CITRIC ACID 500-334 MG/5ML PO SOLN: 30.0000 mL | ORAL | Status: DC | PRN

## 2023-03-23 MED ORDER — ACETAMINOPHEN 325 MG PO TABS
650.0000 mg | ORAL_TABLET | ORAL | Status: DC | PRN
  Administered 2023-03-24 (×2): 650 mg via ORAL
  Filled 2023-03-23 (×2): qty 2

## 2023-03-23 MED ORDER — FENTANYL CITRATE (PF) 100 MCG/2ML IJ SOLN: 50.0000 ug | INTRAMUSCULAR | Status: DC | PRN

## 2023-03-23 MED ORDER — IBUPROFEN 600 MG PO TABS
600.0000 mg | ORAL_TABLET | Freq: Four times a day (QID) | ORAL | Status: DC
Start: 2023-03-24 — End: 2023-03-25
  Administered 2023-03-24 – 2023-03-25 (×8): 600 mg via ORAL
  Filled 2023-03-23 (×8): qty 1

## 2023-03-23 MED ORDER — ONDANSETRON HCL 4 MG/2ML IJ SOLN
4.0000 mg | Freq: Four times a day (QID) | INTRAMUSCULAR | Status: DC | PRN
Start: 2023-03-23 — End: 2023-03-23

## 2023-03-23 MED ORDER — ZOLPIDEM TARTRATE 5 MG PO TABS: 5.0000 mg | ORAL_TABLET | Freq: Every evening | ORAL | Status: DC | PRN

## 2023-03-23 MED ORDER — ACETAMINOPHEN 325 MG PO TABS
650.0000 mg | ORAL_TABLET | ORAL | Status: DC | PRN
Start: 2023-03-23 — End: 2023-03-23

## 2023-03-23 MED ORDER — LACTATED RINGERS IV SOLN: INTRAVENOUS | Status: DC

## 2023-03-23 MED ORDER — OXYTOCIN-SODIUM CHLORIDE 30-0.9 UT/500ML-% IV SOLN
2.5000 [IU]/h | INTRAVENOUS | Status: DC
  Filled 2023-03-23: qty 500

## 2023-03-23 MED ORDER — SIMETHICONE 80 MG PO CHEW: 80.0000 mg | CHEWABLE_TABLET | ORAL | Status: DC | PRN

## 2023-03-23 MED ORDER — TRANEXAMIC ACID-NACL 1000-0.7 MG/100ML-% IV SOLN
INTRAVENOUS | Status: AC
  Administered 2023-03-23: 1000 mg
  Filled 2023-03-23: qty 100

## 2023-03-23 MED ORDER — BENZOCAINE-MENTHOL 20-0.5 % EX AERO
1.0000 | INHALATION_SPRAY | CUTANEOUS | Status: DC | PRN
  Administered 2023-03-24: 1 via TOPICAL
  Filled 2023-03-23: qty 56

## 2023-03-23 MED ORDER — TETANUS-DIPHTH-ACELL PERTUSSIS 5-2.5-18.5 LF-MCG/0.5 IM SUSY: 0.5000 mL | PREFILLED_SYRINGE | Freq: Once | INTRAMUSCULAR | Status: DC

## 2023-03-23 MED ORDER — SENNOSIDES-DOCUSATE SODIUM 8.6-50 MG PO TABS
2.0000 | ORAL_TABLET | ORAL | Status: DC
  Administered 2023-03-24 (×2): 2 via ORAL
  Filled 2023-03-23 (×2): qty 2

## 2023-03-23 MED ORDER — WITCH HAZEL-GLYCERIN EX PADS: 1.0000 | MEDICATED_PAD | CUTANEOUS | Status: DC | PRN

## 2023-03-23 MED ORDER — DIPHENHYDRAMINE HCL 25 MG PO CAPS: 25.0000 mg | ORAL_CAPSULE | Freq: Four times a day (QID) | ORAL | Status: DC | PRN

## 2023-03-23 MED ORDER — COCONUT OIL OIL: 1.0000 | TOPICAL_OIL | Status: DC | PRN

## 2023-03-23 MED ORDER — OXYCODONE-ACETAMINOPHEN 5-325 MG PO TABS: 1.0000 | ORAL_TABLET | ORAL | Status: DC | PRN

## 2023-03-23 MED ORDER — DIBUCAINE (PERIANAL) 1 % EX OINT: 1.0000 | TOPICAL_OINTMENT | CUTANEOUS | Status: DC | PRN

## 2023-03-23 MED ORDER — ONDANSETRON HCL 4 MG/2ML IJ SOLN: 4.0000 mg | INTRAMUSCULAR | Status: DC | PRN

## 2023-03-23 MED ORDER — PRENATAL MULTIVITAMIN CH
1.0000 | ORAL_TABLET | Freq: Every day | ORAL | Status: DC
Start: 2023-03-24 — End: 2023-03-25
  Administered 2023-03-24 – 2023-03-25 (×2): 1 via ORAL
  Filled 2023-03-23 (×2): qty 1

## 2023-03-23 MED ORDER — LIDOCAINE HCL (PF) 1 % IJ SOLN
30.0000 mL | INTRAMUSCULAR | Status: AC | PRN
  Administered 2023-03-23: 30 mL via SUBCUTANEOUS
  Filled 2023-03-23: qty 30

## 2023-03-23 MED ORDER — OXYCODONE-ACETAMINOPHEN 5-325 MG PO TABS
2.0000 | ORAL_TABLET | ORAL | Status: DC | PRN
Start: 2023-03-23 — End: 2023-03-23

## 2023-03-23 MED ORDER — ONDANSETRON HCL 4 MG PO TABS: 4.0000 mg | ORAL_TABLET | ORAL | Status: DC | PRN

## 2023-03-23 MED ORDER — LACTATED RINGERS IV SOLN: 500.0000 mL | INTRAVENOUS | Status: DC | PRN

## 2023-03-23 NOTE — Progress Notes (Signed)
Patient ID: [name], female   DOB: 1985-11-02, 37 y.o.   MRN: [id number]  Subjective: -Care assumed of 37 y.o. [name] at [redacted]w[redacted]d who presents for PROM. In room to meet acquaintance of patient and family.  Patient in shower and reports contractions are closer in nature.  Patient states that she would like to get in WB tub.   Objective: BP 124/78   Pulse 66   Temp 97.9 F (36.6 C) (Oral)   Resp 18   Ht 5\' 11"  (1.803 m)   Wt 90.3 kg   SpO2 100%   BMI 27.75 kg/m  No intake/output data recorded. No intake/output data recorded.  Fetal Monitoring: FHT: 125 bpm by doppler UC: Palpates Strong  Physical Exam: General appearance: alert, well appearing, and in no distress. Chest: not examined.  not examined. Abdominal exam: Gravid, Appears AGA. Extremities: No edema Skin exam: Warm Dry  Vaginal Exam: SVE:   Dilation: 10 Effacement (%): 100 Station: Plus 3 Exam by:: [name] Membranes:SROM x  Internal Monitors: None  Augmentation/Induction: Pitocin:None Cytotec: None  Assessment:  IUP at 37 weeks Desires WB 2nd Stage Labor  Plan: -Patient agreeable to cervical exam which reveals complete dilation. -Tub filling and patient to get in once water level above bench. -Anticipate SVD.    Julia Copa [place] N[place],MSN, CNM 03/23/2023, 8:57 PM

## 2023-03-23 NOTE — Progress Notes (Signed)
Labor Progress Note [name] is a 37 y.o. [id number] at [redacted]w[redacted]d presented for SROM at 1215.  S:  Coping well with labor support.   O:  BP 125/86   Pulse 62   Temp 98.3 F (36.8 C) (Axillary)   Resp 18   Ht 5\' 11"  (1.803 m)   Wt 90.3 kg   SpO2 100%   BMI 27.75 kg/m  EFM: baseline 125 bpm/ mod variability/ 15x15 accels/ absent decels  Toco/IUPC: irregular SVE: Dilation: 2 Effacement (%): 70 Cervical Position: Posterior Station: -1 Presentation: Vertex Exam by:: Joycie Peek Hill CNM Pitocin: 0 mu/min  A/P: 37 y.o. G4P1021 [redacted]w[redacted]d  1. Labor: SROM at 1215 now in latent phase of labor. RBA of foley balloon placement, patient wishes to proceed. Filled with 50cc of fluid. Tolerated procedure well. Anticipate will only remain in place a limited time. Membrane sweep additionally provided at time of placement after RBA discussion. Patient wishes to avoid other interventions at this time. Plan to reassess in several hours or PRN as dictated by maternal/fetal condition and labor progress.  2. FWB: Cat 1 3. Pain: coping well with labor support 4. Anemia - RBA of active management of 3rd stage discussed. Patient consents to post-birth pitocin and TXA.    Anticipate NVSB.  Richardson Landry, CNM 3:20 PM

## 2023-03-23 NOTE — Progress Notes (Signed)
Labor Progress Note [name] is a 37 y.o. O1H0865 at [redacted]w[redacted]d presented for SROM.   S:  Family at bedside providing support. Coping well.  O:  BP 124/78   Pulse 66   Temp 97.9 F (36.6 C) (Oral)   Resp 18   Ht 5\' 11"  (1.803 m)   Wt 90.3 kg   SpO2 100%   BMI 27.75 kg/m  EFM: baseline 125 bpm/ mod variability/ 15x15 accels/ no decels  Toco/IUPC: tracing poorly SVE: Dilation: 4.5 Effacement (%): 80 Cervical Position: Posterior Station: -1 Presentation: Vertex Exam by:: Lamont Snowball ,CNM  A/P: 37 y.o. [id number] [redacted]w[redacted]d  1. Labor: Coping well, desires minimal interventions. Would consider IUPC/Pitocin if not progressing as expected. Will discuss possibility of WB with Gerrit Heck, CNM and water birth provider today 2. FWB: Cat 1 3. Pain: Coping well. Desires to labor in water when active.  4. Anemia: Plan for TXA at delivery  Sundra Aland, MD OB Fellow, Faculty Practice [facility], Center for [facility]  03/23/23 8:59 PM

## 2023-03-23 NOTE — H&P (Cosign Needed Addendum)
OBSTETRIC ADMISSION HISTORY AND PHYSICAL  [name] is a 37 y.o. female [phone] with IUP at [redacted]w[redacted]d by 1st TMUS presenting for SROM @ 1215. She reports +FMs, No LOF, no VB, no blurry vision, headaches or peripheral edema, and RUQ pain.  She plans on breast feeding. She declines birth control(same sex partner). She received her prenatal care at  [facility]    Dating: By 1st TMUS --->  Estimated Date of Delivery: 04/13/23  Sono:   @[redacted]w[redacted]d , CWD, normal anatomy, cephalic presentation, posterior fundal placenta, 1389 gm 3 lb 1 oz 80 % EFW. Posterior fundal. Anterior myoma 4.74 x 2.55 x 3.8 intramural.   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  Past Medical History: Past Medical History:  Diagnosis Date   Medical history non-contributory     Past Surgical History: Past Surgical History:  Procedure Laterality Date   DILATION AND CURETTAGE OF UTERUS      Obstetrical History: OB History     Gravida  4   Para  1   Term  1   Preterm      AB  2   Living  1      SAB  1   IAB  1   Ectopic      Multiple      Live Births  1           Social History Social History   Socioeconomic History   Marital status: Single    Spouse name: Not on file   Number of children: Not on file   Years of education: Not on file   Highest education level: Not on file  Occupational History   Not on file  Tobacco Use   Smoking status: Never   Smokeless tobacco: Never  Vaping Use   Vaping status: Never Used  Substance and Sexual Activity   Alcohol use: Not Currently    Comment: Socially   Drug use: No   Sexual activity: Yes    Birth control/protection: Implant    Comment: implanon placed 2010  Other Topics Concern   Not on file  Social History Narrative   Not on file   Social Determinants of Health   Financial Resource Strain: Not on file   Food Insecurity: Not on file  Transportation Needs: Not on file  Physical Activity: Not on file  Stress: Not on file  Social Connections: Not on file    Family History: Family History  Problem Relation Age of Onset   Asthma Mother    Cancer Father    Asthma Sister    Asthma Brother    Diabetes Maternal Aunt    Diabetes Maternal Uncle    Diabetes Maternal Grandmother    Diabetes Maternal Grandfather    Hypertension Paternal Grandmother    Hypertension Paternal Grandfather     Allergies: Allergies  Allergen Reactions   Sulfa Antibiotics Hives and Swelling   Tomato Other (See Comments)    "Lips and throat itch and swell"    Medications Prior to Admission  Medication Sig Dispense Refill Last Dose   ferrous sulfate 325 (65 FE) MG tablet Take 1 tablet (325 mg total) by mouth daily. 30 tablet 0 03/23/2023   Prenatal Vit-Fe Fumarate-FA (MULTIVITAMIN-PRENATAL) 27-0.8 MG TABS tablet Take 1 tablet by mouth daily at 12 noon.   03/23/2023   nitrofurantoin, macrocrystal-monohydrate, (MACROBID)  100 MG capsule Take 1 capsule (100 mg total) by mouth every 12 (twelve) hours. (Patient not taking: Reported on 03/23/2023) 9 capsule 0 Not Taking   Omega-3 Fat Ac-Cholecalciferol (SUPERIOR OMEGA3 W/ VITAMIN D PO) Take by mouth.        Review of Systems   All systems reviewed and negative except as stated in HPI  Blood pressure 121/81, pulse 78, temperature (!) 97.5 F (36.4 C), temperature source Oral, resp. rate 16, height 5\' 11"  (1.803 m), weight 90.2 kg, SpO2 100%. General appearance: alert, cooperative, appears stated age, and mild distress Lungs: clear to auscultation bilaterally Heart: regular rate and rhythm Abdomen: soft, non-tender; bowel sounds normal Pelvic: adequate Extremities: Homans sign is negative, no sign of DVT Presentation: cephalic Fetal monitoringBaseline: 130 bpm, Variability: Good {> 6 bpm), Accelerations: Reactive, and Decelerations: Absent Uterine  activityFrequency: Every 5-7 minutes     Prenatal labs: ABO, Rh: O/Positive/-- (07/26 2952) Antibody: Negative (07/26 0917) Rubella: 2.29 (07/26 0917) RPR: Non Reactive (09/16 0835)  HBsAg: Negative (07/26 0917)  HIV: Non Reactive (09/16 0835)  GBS:   collected on admission; results pending 2 hr Glucola passed Genetic screening  LR female, Positive for Alpha-Thalassemia, Spinal Muscular Atrophy  Anatomy US normal  Prenatal Transfer Tool  Maternal Diabetes: No Genetic Screening: Abnormal:  Results: Other: LR female, Positive for Alpha-Thalassemia, Spinal Muscular Atrophy  Maternal Ultrasounds/Referrals: Normal Fetal Ultrasounds or other Referrals:  None Maternal Substance Abuse:  No Significant Maternal Medications:  None Significant Maternal Lab Results:  Other: GBS unknown Number of Prenatal Visits:greater than 3 verified prenatal visits Other Comments:  None  No results found for this or any previous visit (from the past 24 hour(s)).  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 W4X3244 at [redacted]w[redacted]d here for SROM 11/16 @ 1216  #Labor: Expectant management per pt request #Pain: Discussed options; declined for now. Has doula and partner for labor support.  #FWB: Cat I #ID:  GBS unknown; culture collected. Term unknown will not treat.  #MOF: Breast #MOC: Declined #Circ:  Yes # Anemia - Active management planned. Pitocin and TXA at delivery.   Lamont Snowball, MSN, CNM, RNC-OB Certified Nurse Midwife, [facility] 03/23/2023 2:06 PM

## 2023-03-23 NOTE — MAU Note (Signed)
..  [name] is a 37 y.o. at [redacted]w[redacted]d here in MAU reporting: contractions that started around 0700 this morning. She noticed leaking of fluid after using the restroom around 1215. +Fm, Denies vaginal bleeding.  Onset of complaint: 03/23/2023 Pain score: 8/10 Vitals:   03/23/23 1258  BP: 121/81  Pulse: 78  Resp: 16  Temp: (!) 97.5 F (36.4 C)  SpO2: 100%     FHT:125 Lab orders placed from triage: UA

## 2023-03-23 NOTE — Progress Notes (Addendum)
Labor Progress Note [name] is a 37 y.o. [id number] at [redacted]w[redacted]d presented for SROM.   S:  Coping well in latent phase of labor.   O:  BP 124/78   Pulse 66   Temp 97.9 F (36.6 C) (Oral)   Resp 18   Ht 5\' 11"  (1.803 m)   Wt 90.3 kg   SpO2 100%   BMI 27.75 kg/m  EFM: baseline 125 bpm/ mod variability/ 15x15 accels/ variable decels  Toco/IUPC: 4-5 per patient and palpation, tracing poorly SVE: Dilation: 4.5 Effacement (%): 80 Cervical Position: Posterior Station: -1 Presentation: Vertex Exam by:: Lamont Snowball ,CNM Pitocin: 0 mu/min  A/P: 37 y.o. G4P1021 [redacted]w[redacted]d  1. Labor: SROM currently in latent phase of labor. Suspect progression to active phase imminently. Patient desires to labor in the water.  2. FWB: Cat 2, overall reassuring 3. Pain: Coping well. Desires to labor in water when active.  4. Anemia - active management of 3rd stage   Anticipate NVSB.   - Pt interested in waterbirth and has attended the class.  - Reviewed conditions in labor that will risk her out of water immersion including thick meconium or blood stained amniotic fluid, non-reassuring fetal status on monitor, excessive bleeding, hypertension, dizziness, use of IV meds, damaged equipment or staffing that does not allow for water immersion, etc.  - The attending midwife must be on the unit for water immersion to begin; pt understands this may delay the start of water immersion. - Reminded pt that signing consent in labor at the hospital also acknowledges they will exit the tub if the attending midwife requests. - Consent given to patient for review.  Consent will be reviewed and signed at the hospital by the waterbirth provider prior to use of the tub. - Discussed other labor support options if waterbirth becomes unavailable, including position change, freedom of movement, use of birthing ball, and/or use of hydrotherapy in the shower (dependent upon medical condition/provider discretion).    Lamont Snowball, MSN, CNM, RNC-OB Certified Nurse Midwife, [facility] 03/23/2023 7:44 PM

## 2023-03-24 ENCOUNTER — Inpatient Hospital Stay (HOSPITAL_COMMUNITY): Payer: Medicaid Other

## 2023-03-24 LAB — COMPREHENSIVE METABOLIC PANEL
ALT: 12 U/L (ref 0–44)
AST: 19 U/L (ref 15–41)
Albumin: 2.2 g/dL — ABNORMAL LOW (ref 3.5–5.0)
Alkaline Phosphatase: 128 U/L — ABNORMAL HIGH (ref 38–126)
Anion gap: 6 (ref 5–15)
BUN: 8 mg/dL (ref 6–20)
CO2: 20 mmol/L — ABNORMAL LOW (ref 22–32)
Calcium: 9 mg/dL (ref 8.9–10.3)
Chloride: 109 mmol/L (ref 98–111)
Creatinine, Ser: 0.79 mg/dL (ref 0.44–1.00)
GFR, Estimated: >60 mL/min (ref >60)
Glucose, Bld: 92 mg/dL (ref 70–99)
Potassium: 3.8 mmol/L (ref 3.5–5.1)
Sodium: 135 mmol/L (ref 135–145)
Total Bilirubin: 0.5 mg/dL (ref <1.2)
Total Protein: 4.9 g/dL — ABNORMAL LOW (ref 6.5–8.1)

## 2023-03-24 LAB — CBC
HCT: 25.2 % — ABNORMAL LOW (ref 36.0–46.0)
Hemoglobin: 8.4 g/dL — ABNORMAL LOW (ref 12.0–15.0)
MCH: 26.7 pg (ref 26.0–34.0)
MCHC: 33.3 g/dL (ref 30.0–36.0)
MCV: 80 fL (ref 80.0–100.0)
Platelets: 149 10*3/uL — ABNORMAL LOW (ref 150–400)
RBC: 3.15 MIL/uL — ABNORMAL LOW (ref 3.87–5.11)
RDW: 22.1 % — ABNORMAL HIGH (ref 11.5–15.5)
WBC: 10 10*3/uL (ref 4.0–10.5)
nRBC: 0 % (ref 0.0–0.2)

## 2023-03-24 LAB — RPR: RPR Ser Ql: NONREACTIVE

## 2023-03-24 MED ORDER — FUROSEMIDE 20 MG PO TABS
20.0000 mg | ORAL_TABLET | Freq: Every day | ORAL | Status: DC
  Administered 2023-03-24 – 2023-03-25 (×2): 20 mg via ORAL
  Filled 2023-03-24 (×2): qty 1

## 2023-03-24 MED ORDER — SODIUM CHLORIDE 0.9 % IV SOLN
500.0000 mg | Freq: Once | INTRAVENOUS | Status: AC
  Administered 2023-03-24: 500 mg via INTRAVENOUS
  Filled 2023-03-24: qty 25

## 2023-03-24 MED ORDER — ACETAMINOPHEN-CAFFEINE 500-65 MG PO TABS
2.0000 | ORAL_TABLET | Freq: Once | ORAL | Status: AC
  Administered 2023-03-24: 2 via ORAL
  Filled 2023-03-24: qty 2

## 2023-03-24 MED ORDER — NIFEDIPINE ER OSMOTIC RELEASE 30 MG PO TB24
30.0000 mg | ORAL_TABLET | Freq: Every day | ORAL | Status: DC
  Administered 2023-03-24 – 2023-03-25 (×2): 30 mg via ORAL
  Filled 2023-03-24 (×2): qty 1

## 2023-03-24 NOTE — Progress Notes (Signed)
IV Venofer paused due to IV infiltration, site bruised and swollen.  IV discontinued from left forearm.  Pt states she is agreeable for new IV to be started and restarting the infusion at a later time but is requesting a break for now.

## 2023-03-24 NOTE — Progress Notes (Signed)
Pt's BPs after admission to [facility] 122/87 @ 2330, 134/87 @ 0035. Called Dr. Lucianne Muss at [phone] but MD in another pt's room and asked for return call when able. Received return call at [phone]; reviewed initial 2 BPs after admission to [facility] and also noted that LPN working with this RN had just charted VS at 0332 with BP 121/86. Per Dr. Lucianne Muss, will order lasix and procardia and add CMP to morning lab draw with CBC. LPN out of room after call with MD ended and stated that pt isn't "feeling well', c/o headache and congestion,feels hot to touch but temperature 97.7. Dr. Lucianne Muss called back and updated on pt's complaints and states she will order excedrin and be up to see pt. Pt updated on plan of care.

## 2023-03-24 NOTE — Progress Notes (Signed)
Brief progress note:  Called to bedside to evaluate patient -- reports feeling unwell in general with headache that she rates as 4/10. She recalls last meal was approximately 2 days ago and feels hungry. Also feels flushed, feels it may be due to being in tub. Denies vision changes, CP, dyspnea, RUQ pain, edema. Otherwise feeling in usual state of health. BP notable for elevated diastolic BP x 3 readings. On exam, NAD, EOMI, no focal neuro deficits, breathing comfortably, no edema, fundus appropriately tender. Given Excedrin, CBC and CMP ordered. Will start on Procardia 30mg  and Lasix. Plan discussed with patient and care team.  Sundra Aland, MD OB Fellow, Faculty Practice [facility], Center for [facility]

## 2023-03-24 NOTE — Progress Notes (Signed)
BP 111/82 (BP Location: Right Arm)   Pulse 65   Temp 97.6 F (36.4 C) (Oral)   Resp 16   Ht 5\' 11"  (1.803 m)   Wt 90.3 kg   SpO2 97%   Breastfeeding Unknown   BMI 27.75 kg/m   CNM to room to discuss RBA of vitamin K prophylaxis for the newborn. The patient had previously thought that oral vitamin K prophylaxis was available in the hospital and would be acceptable. After counseling patient that this was not the care and that vitamin K prophylaxis via IM injection is required prior to circumcision, patient chooses to accept vitamin K for infant.   Lamont Snowball, MSN, CNM, RNC-OB Certified Nurse Midwife, [facility] 03/24/2023 10:23 AM

## 2023-03-24 NOTE — Progress Notes (Signed)
POSTPARTUM PROGRESS NOTE  Post Partum Day 1  Subjective:  [name] is a 37 y.o. [id number] s/p SVD at [redacted]w[redacted]d.  She reports she is doing well. No acute events overnight. She denies any problems with ambulating, voiding or po intake. Denies nausea or vomiting.  Pain is well controlled.  Lochia is minimal. Reports headache and symptoms resolved with Excedrin. Passed one nickel sized clot and felt significant relief of symptoms.  Objective: Blood pressure 137/87, pulse 70, temperature 97.8 F (36.6 C), temperature source Oral, resp. rate 18, height 5\' 11"  (1.803 m), weight 90.3 kg, SpO2 100%, unknown if currently breastfeeding.  Physical Exam:  General: alert, cooperative and no distress Chest: no respiratory distress Heart:regular rate, distal pulses intact Uterine Fundus: firm, appropriately tender DVT Evaluation: No calf swelling or tenderness Extremities: No edema Skin: warm, dry  Recent Labs    03/23/23 1306 03/24/23 0359  HGB 9.1* 8.4*  HCT 25.3* 25.2*    Assessment/Plan: [name] is a 37 y.o. M0N0272 s/p SVD at [redacted]w[redacted]d   PPD#1 - Doing well  Routine postpartum care  Anemia of pregnancy in postpartum period: Discussed drop in HgB from 9.1 to 8.4  pt was scheduled for IV iron, will give a dose here to see if this helps with fatigue.  Postpartum HTN: Started Procardia 30 and Lasix. PEC labs reassuring.  Contraception: declines Feeding: breast Dispo: Plan for discharge tomorrow.   LOS: 1 day   Sundra Aland, MD OB Fellow  03/24/2023, 7:33 AM

## 2023-03-24 NOTE — Lactation Note (Signed)
This note was copied from a baby's chart. Lactation Consultation Note  Patient Name: [name] [id number] Date: 03/24/2023 Age:37 hours Reason for consult: Initial assessment;Early term 37-38.6wks;Breastfeeding assistance;1st time breastfeeding;Maternal endocrine disorder  P2 [redacted]w[redacted]d:  Birth mother attempting to offer baby the breast, assisted with positioning. Infant sleepy after multiple attempts. Discussed use of nipple shield #20 to help with optimal latch. Infant held nipple only. Birth mother has attempted to breastfeed but baby has not fed since birth. Mom is having concerns with infant intake.  Set up DEBP using size 21mm flanges. Educated on pump frequency and cleaning of pump parts and nipple shield. Discussed hand expression and demonstrated to patient. Patient demonstrated back.   Discussed donor breast milk option. Patient signed consent. Infant's other mom fed baby 12ml via bottle. Discussed early term feeding behaviors and initiated infant feeding plan that aligns with a late-preterm infant.   Maternal Data Has patient been taught Hand Expression?: Yes Does the patient have breastfeeding experience prior to this delivery?: No  Feeding Mother's Current Feeding Choice: Breast Milk and Donor Milk Nipple Type: Nfant Standard Flow (white)  LATCH Score Latch: Too sleepy or reluctant, no latch achieved, no sucking elicited.  Audible Swallowing: None  Type of Nipple: Inverted  Comfort (Breast/Nipple): Soft / non-tender  Hold (Positioning): Full assist, staff holds infant at breast  LATCH Score: 2   Lactation Tools Discussed/Used Tools: Pump;Flanges;Shells;Nipple Shields Nipple shield size: 20 Flange Size: 21 Breast pump type: Double-Electric Breast Pump Pump Education: Setup, frequency, and cleaning;Milk Storage Reason for Pumping: Early term infant 37weeks and not latching well Pumped volume: 0 mL  Interventions Interventions: Breast feeding basics  reviewed;Assisted with latch;Position options;Support pillows;Adjust position;Hand express;DEBP;Education;Shells;LPT handout/interventions;CDC Guidelines for Breast Pump Cleaning;Skin to skin;Pre-pump if needed;LC Services brochure  Discharge Pump: DEBP;Manual (planning to get a personal pump)  Consult Status Consult Status: Follow-up Date: 03/25/23 Follow-up type: In-patient    Su Grand 03/24/2023, 12:07 PM

## 2023-03-24 NOTE — Progress Notes (Signed)
BP 125/80 (BP Location: Right Arm)   Pulse (!) 56   Temp 98.2 F (36.8 C) (Oral)   Resp 18   Ht 5\' 11"  (1.803 m)   Wt 90.3 kg   SpO2 99%   Breastfeeding Unknown   BMI 27.75 kg/m    CNM called to bedside due to patient report of chest pain.   Lungs clear in all lobes. Heart with regular rate and rhythm.   Patient reports that pain is deep in chest on left side approximately 2 inches from sternal border and 2 inches inferior to clavicle.   CXR and EKG ordered stat at bedside.   Vitals and limited PE reassuring.  Resume venofer infusion when IV access is reestablished.  Lamont Snowball, MSN, CNM, RNC-OB Certified Nurse Midwife, [facility] 03/24/2023 1:56 PM

## 2023-03-24 NOTE — Discharge Summary (Signed)
Postpartum Discharge Summary  Date of Service updated-11/18     Patient Name: [name] DOB: Mar 28, 1986 MRN: [id number]  Date of admission: 03/23/2023 Delivery date:03/23/2023 Delivering provider: Gerrit Heck Date of discharge: 03/25/2023  Admitting diagnosis: Labor abnormal [O62.9] Intrauterine pregnancy: [redacted]w[redacted]d     Secondary diagnosis:  Principal Problem:   SVD (spontaneous vaginal delivery) Active Problems:   Vaginal delivery   Obstetric vaginal laceration  Additional problems: Preeclampsia    Discharge diagnosis: Term Pregnancy Delivered and Preeclampsia (mild)                                              Post partum procedures: IV iron transfusion Augmentation: IP Foley Complications: None  Hospital course: Onset of Labor With Vaginal Delivery      37 y.o. yo [id number] at [redacted]w[redacted]d was admitted in Latent Labor on 03/23/2023. Labor course was uncomplicated.  Patient augmented with foley balloon s/p 3 hours from SROM with spontaneous expulsion. Patient progressed to complete and delivered in WB tub with family and staff support.  Membrane Rupture Time/Date: 12:15 PM,03/23/2023  Delivery Method:Vaginal, Spontaneous Operative Delivery:N/A Episiotomy: None Lacerations:  Vaginal Patient had a postpartum course complicated by preeclampsia without severe features.  Pt was treated with Lasix and Procardia XL daily.  Additionally, due to anemia, pt was given IV iron.  She is ambulating, tolerating a regular diet, passing flatus, and urinating well. Patient is discharged home in stable condition on 03/25/23.  Newborn Data: Birth date:03/23/2023 Birth time:9:26 PM Gender:Female-[name] Living status:Living Apgars:8 ,9  Weight:2790 g  Magnesium Sulfate received: No BMZ received: No Rhophylac:No MMR:No T-DaP: offered postpartum Flu: No RSV Vaccine received: No Transfusion:No  Immunizations received: There is no immunization history for the selected administration types on  file for this patient.  Physical exam  Vitals:   03/24/23 1500 03/24/23 2007 03/24/23 2300 03/25/23 0543  BP: (!) 128/90 125/88 120/83 106/64  Pulse: 75 71 73 70  Resp: 18 18 20 19   Temp: 97.8 F (36.6 C) 97.6 F (36.4 C)  97.9 F (36.6 C)  TempSrc: Oral Oral  Oral  SpO2: 98% 95% 99% 99%  Weight:      Height:       General: alert, cooperative, and no distress Lochia: appropriate Uterine Fundus: firm Incision: N/A DVT Evaluation: No evidence of DVT seen on physical exam. Labs: Lab Results  Component Value Date   WBC 7.3 03/24/2023   HGB 7.6 (L) 03/24/2023   HCT 23.5 (L) 03/24/2023   MCV 80.2 03/24/2023   PLT 134 (L) 03/24/2023      Latest Ref Rng & Units 03/24/2023   11:56 PM  CMP  Glucose 70 - 99 mg/dL 98   BUN 6 - 20 mg/dL 8   Creatinine 1.47 - 8.29 mg/dL 5.62   Sodium 130 - 865 mmol/L 137   Potassium 3.5 - 5.1 mmol/L 3.8   Chloride 98 - 111 mmol/L 108   CO2 22 - 32 mmol/L 21   Calcium 8.9 - 10.3 mg/dL 8.4   Total Protein 6.5 - 8.1 g/dL 5.1   Total Bilirubin <7.8 mg/dL 0.5   Alkaline Phos 38 - 126 U/L 125   AST 15 - 41 U/L 22   ALT 0 - 44 U/L 15    Edinburgh Score:    03/25/2023    9:15 AM  Inocente Salles Postnatal  Depression Scale Screening Tool  I have been able to laugh and see the funny side of things. 0  I have looked forward with enjoyment to things. 0  I have blamed myself unnecessarily when things went wrong. 0  I have been anxious or worried for no good reason. 2  I have felt scared or panicky for no good reason. 0  Things have been getting on top of me. 1  I have been so unhappy that I have had difficulty sleeping. 0  I have felt sad or miserable. 0  I have been so unhappy that I have been crying. 0  The thought of harming myself has occurred to me. 0  Edinburgh Postnatal Depression Scale Total 3   Edinburgh Postnatal Depression Scale Total: 3   After visit meds:  Allergies as of 03/25/2023       Reactions   Sulfa Antibiotics Hives,  Swelling   Tomato Other (See Comments)   "Lips and throat itch and swell"        Medication List     STOP taking these medications    ferrous sulfate 325 (65 FE) MG tablet   multivitamin-prenatal 27-0.8 MG Tabs tablet   nitrofurantoin (macrocrystal-monohydrate) 100 MG capsule Commonly known as: MACROBID   SUPERIOR OMEGA3 W/ VITAMIN D PO       TAKE these medications    acetaminophen 325 MG tablet Commonly known as: Tylenol Take 2 tablets (650 mg total) by mouth every 4 (four) hours as needed (for pain scale < 4).   furosemide 20 MG tablet Commonly known as: LASIX Take 1 tablet (20 mg total) by mouth daily for 5 days. Start taking on: March 26, 2023   ibuprofen 600 MG tablet Commonly known as: ADVIL Take 1 tablet (600 mg total) by mouth every 6 (six) hours.   NIFEdipine 30 MG 24 hr tablet Commonly known as: ADALAT CC Take 1 tablet (30 mg total) by mouth daily. Start taking on: March 26, 2023         Discharge home in stable condition Infant Feeding: Breast Infant Disposition:home with mother Discharge instruction: per After Visit Summary and Postpartum booklet. Activity: Advance as tolerated. Pelvic rest for 6 weeks.  Diet: routine diet Future Appointments: Future Appointments  Date Time Provider Department Center  03/27/2023  8:15 AM [facility] 5 [facility] None  03/29/2023  8:30 AM [facility] 2 [facility] None  04/01/2023  8:15 AM [facility] 4 [facility] None   Follow up Visit:  Follow-up Information     Center for [facility]. Go to.   Specialty: Obstetrics and Gynecology Why: Please follow up in one week for a BP check Contact information: [address] [place] [address] [phone]               Message sent 03/24/2023   Please schedule this patient for a Virtual postpartum visit in 6 week with the following provider: Any provider. Additional Postpartum F/U:BP check  1 week  Low risk pregnancy complicated by: None Delivery mode:  Vaginal, Spontaneous Anticipated Birth Control:   None   03/25/2023 Sharon Seller, DO

## 2023-03-25 ENCOUNTER — Encounter: Payer: Medicaid Other | Admitting: Obstetrics and Gynecology

## 2023-03-25 LAB — CBC
HCT: 23.5 % — ABNORMAL LOW (ref 36.0–46.0)
Hemoglobin: 7.6 g/dL — ABNORMAL LOW (ref 12.0–15.0)
MCH: 25.9 pg — ABNORMAL LOW (ref 26.0–34.0)
MCHC: 32.3 g/dL (ref 30.0–36.0)
MCV: 80.2 fL (ref 80.0–100.0)
Platelets: 134 10*3/uL — ABNORMAL LOW (ref 150–400)
RBC: 2.93 MIL/uL — ABNORMAL LOW (ref 3.87–5.11)
RDW: 22.1 % — ABNORMAL HIGH (ref 11.5–15.5)
WBC: 7.3 10*3/uL (ref 4.0–10.5)
nRBC: 0 % (ref 0.0–0.2)

## 2023-03-25 LAB — COMPREHENSIVE METABOLIC PANEL
ALT: 15 U/L (ref 0–44)
AST: 22 U/L (ref 15–41)
Albumin: 2.2 g/dL — ABNORMAL LOW (ref 3.5–5.0)
Alkaline Phosphatase: 125 U/L (ref 38–126)
Anion gap: 8 (ref 5–15)
BUN: 8 mg/dL (ref 6–20)
CO2: 21 mmol/L — ABNORMAL LOW (ref 22–32)
Calcium: 8.4 mg/dL — ABNORMAL LOW (ref 8.9–10.3)
Chloride: 108 mmol/L (ref 98–111)
Creatinine, Ser: 0.82 mg/dL (ref 0.44–1.00)
GFR, Estimated: >60 mL/min (ref >60)
Glucose, Bld: 98 mg/dL (ref 70–99)
Potassium: 3.8 mmol/L (ref 3.5–5.1)
Sodium: 137 mmol/L (ref 135–145)
Total Bilirubin: 0.5 mg/dL (ref <1.2)
Total Protein: 5.1 g/dL — ABNORMAL LOW (ref 6.5–8.1)

## 2023-03-25 LAB — TROPONIN I (HIGH SENSITIVITY)
Troponin I (High Sensitivity): 5 ng/L (ref <18)
Troponin I (High Sensitivity): 5 ng/L (ref <18)

## 2023-03-25 LAB — BRAIN NATRIURETIC PEPTIDE: B Natriuretic Peptide: 199.5 pg/mL — ABNORMAL HIGH (ref 0.0–100.0)

## 2023-03-25 MED ORDER — FUROSEMIDE 20 MG PO TABS: 20.0000 mg | ORAL_TABLET | Freq: Every day | ORAL | 0 refills | Status: DC

## 2023-03-25 MED ORDER — ACETAMINOPHEN 325 MG PO TABS: 650.0000 mg | ORAL_TABLET | ORAL | Status: DC | PRN

## 2023-03-25 MED ORDER — KETOROLAC TROMETHAMINE 30 MG/ML IJ SOLN: 30.0000 mg | Freq: Once | INTRAMUSCULAR | Status: DC

## 2023-03-25 MED ORDER — IBUPROFEN 600 MG PO TABS: 600.0000 mg | ORAL_TABLET | Freq: Four times a day (QID) | ORAL | 0 refills | Status: AC

## 2023-03-25 MED ORDER — NIFEDIPINE ER 30 MG PO TB24: 30.0000 mg | ORAL_TABLET | Freq: Every day | ORAL | 0 refills | Status: DC

## 2023-03-25 NOTE — Progress Notes (Signed)
POSTPARTUM PROGRESS NOTE  PPD #2  Subjective:  [name] is a 37 y.o. [id number] s/p NSVD at [redacted]w[redacted]d. Today she notes that she is doing ok.  Notes that yesterday was tough- had a moderate headache all day with considerable fatigue.  Received IV iron transfusion and notes episode of ?black out.  She has not yet ambulated this am.  Denies dizziness or syncopal event overnight.  Voiding without problems. Denies nausea or vomiting. She has passed flatus, no BM.  Pain is well controlled.  Lochia appropriate Denies fever/chills/chest pain/SOB.   Objective: Blood pressure 106/64, pulse 70, temperature 97.9 F (36.6 C), temperature source Oral, resp. rate 19, height 5\' 11"  (1.803 m), weight 90.3 kg, SpO2 99%, unknown if currently breastfeeding.  Physical Exam:  General: alert, cooperative and no distress Chest: no respiratory distress, CTAB Heart: regular rate and rhythm Abdomen: soft, non-tender Uterine Fundus: firm, appropriately tender DVT Evaluation: No calf swelling or tenderness Extremities: minimal edema Skin: warm, dry     Latest Ref Rng & Units 03/24/2023   11:56 PM 03/24/2023    3:59 AM 03/23/2023    1:06 PM  CBC  WBC 4.0 - 10.5 K/uL 7.3  10.0  6.7   Hemoglobin 12.0 - 15.0 g/dL 7.6  8.4  9.1   Hematocrit 36.0 - 46.0 % 23.5  25.2  25.3   Platelets 150 - 400 K/uL 134  149  158     Assessment/Plan: JEFFREY GEESAMAN is a 37 y.o. [id number] s/p NSVD at [redacted]w[redacted]d PPD#1 complicated by: 1) Anemia -s/p IV iron transfusion -vitals stable, pt with minimal activity this am; however based on history concern for symptomatic anemia.  Discussed pros/cons of blood transfusion- pt not sure how she wants to proceed this am.Will monitor symptoms and may consider blood transfusion.   2) pain- well controlled 3) circ consent completed- see below  Contraception: declined Feeding: breast  Dispo: Possible discharge home later today, pending maternal status   LOS: 2 days    Circumcision  Consent  Discussed with mom at bedside about circumcision.   Circumcision is a surgery that removes the skin that covers the tip of the penis, called the "foreskin." Circumcision is usually done when a boy is between 74 and 76 days old, sometimes up to 42-19 weeks old.  The most common reasons boys are circumcised include for cultural/religious beliefs or for parental preference (potentially easier to clean, so baby looks like daddy, etc).  There may be some medical benefits for circumcision:   Circumcised boys seem to have slightly lower rates of: ? Urinary tract infections  ? Penis cancer  ? Sexually transmitted infection  ? Phimosis  Boys and men who are not circumcised can reduce these extra risks by: ? Cleaning their penis well ? Using condoms during sex  What are the risks of circumcision?  As with any surgical procedure, there are risks and complications. In circumcision, complications are rare and usually minor, the most common being: ? Bleeding- risk is reduced by holding each clamp for 30 seconds prior to a cut being made, and by holding pressure after the procedure is done ? Infection- the penis is cleaned prior to the procedure, and the procedure is done under sterile technique ? Damage to the urethra or amputation of the penis  How is circumcision done in baby boys?  The baby will be placed on a special table and the legs restrained for their safety. Numbing medication is injected into the penis, and the  skin is cleansed with betadine to decrease the risk of infection.   What to expect:  The penis will look red and raw for 5-7 days as it heals. We expect scabbing around where the cut was made, as well as clear-pink fluid and some swelling of the penis right after the procedure. If your baby's circumcision starts to bleed or develops pus, please contact your pediatrician immediately.  All questions were answered and mother consented.  Myna Hidalgo, DO Attending  Obstetrician & Gynecologist, [facility] for [employer], [facility]

## 2023-03-25 NOTE — Progress Notes (Signed)
Brief progress note:  Call from bedside RN regarding pt's continued c/o chest wall pain. Reproducible on exam, same location -- left mid clavicular area 2 inches from sternal border. RN noted a knot that feels like a clogged milk duct on exam. Otherwise, no HA, vision changes, RUQ pain, edema.  Today's Vitals   03/24/23 2007 03/24/23 2110 03/24/23 2300 03/24/23 2314  BP: 125/88  120/83   Pulse: 71  73   Resp: 18  20   Temp: 97.6 F (36.4 C)     TempSrc: Oral     SpO2: 95%  99%   Weight:      Height:      PainSc: 6  0-No pain  7    Body mass index is 27.75 kg/m.  PEC w/up repeated and was reassuring, BNP and trop both negative, EKG without any ST or T wave changes.   Suspect pain may be MSK related vs related to lactational problem. Low suspicion for ACS, cardiomyopathy, or VTE at this time given vitals reassuring and EKG wnl. LC visit at this time. Will give dose of Toradol as well. Continuing to watch closely.  Sundra Aland, MD OB Fellow, Faculty Practice [facility], Center for [facility]

## 2023-03-25 NOTE — Discharge Instructions (Signed)
WHAT TO LOOK OUT FOR: Fever of 100.4 or above Mastitis: feels like flu and breasts hurt Infection: increased pain, swelling or redness Blood clots golf ball size or larger Postpartum depression   Congratulations!

## 2023-03-25 NOTE — Progress Notes (Signed)
Called Dr. Lucianne Muss with update on pt. Labs and EKG done and resulted. Pt reports pain unchanged. Has tried hot pack to area. Area is tender to touch, with palpable ?nodule/possibly clogged milk duct. Discussed previously with LC who is in to see pt now, pt using DEBP to see if any relief. Dr. Lucianne Muss to place order for IV toradol and verified 1 more troponin level to be drawn.   Pharmacy called RN to discuss toradol order and scheduled per pharmacy for 6 hours after last dose of ibuprofen at 2346.

## 2023-03-25 NOTE — Plan of Care (Signed)
Problem: Health Behavior/Discharge Planning: Goal: Ability to manage health-related needs will improve 03/25/2023 1341 by Donne Hazel, LPN Outcome: Adequate for Discharge 03/25/2023 1341 by Donne Hazel, LPN Outcome: Progressing 03/25/2023 0957 by Donne Hazel, LPN Outcome: Progressing   Problem: Clinical Measurements: Goal: Ability to maintain clinical measurements within normal limits will improve 03/25/2023 1341 by Donne Hazel, LPN Outcome: Adequate for Discharge 03/25/2023 1341 by Donne Hazel, LPN Outcome: Progressing 03/25/2023 0957 by Donne Hazel, LPN Outcome: Progressing Goal: Will remain free from infection 03/25/2023 1341 by Donne Hazel, LPN Outcome: Adequate for Discharge 03/25/2023 1341 by Donne Hazel, LPN Outcome: Progressing 03/25/2023 0957 by Donne Hazel, LPN Outcome: Progressing Goal: Diagnostic test results will improve 03/25/2023 1341 by Donne Hazel, LPN Outcome: Adequate for Discharge 03/25/2023 1341 by Donne Hazel, LPN Outcome: Progressing 03/25/2023 0957 by Donne Hazel, LPN Outcome: Progressing Goal: Respiratory complications will improve 03/25/2023 1341 by Donne Hazel, LPN Outcome: Adequate for Discharge 03/25/2023 1341 by Donne Hazel, LPN Outcome: Progressing 03/25/2023 0957 by Donne Hazel, LPN Outcome: Progressing Goal: Cardiovascular complication will be avoided 03/25/2023 1341 by Donne Hazel, LPN Outcome: Adequate for Discharge 03/25/2023 1341 by Donne Hazel, LPN Outcome: Progressing 03/25/2023 0957 by Donne Hazel, LPN Outcome: Progressing   Problem: Coping: Goal: Level of anxiety will decrease 03/25/2023 1341 by Donne Hazel, LPN Outcome: Adequate for Discharge 03/25/2023 1341 by Donne Hazel, LPN Outcome: Progressing 03/25/2023 0957 by Donne Hazel, LPN Outcome: Progressing   Problem: Elimination: Goal: Will not experience complications related to bowel motility 03/25/2023  1341 by Donne Hazel, LPN Outcome: Adequate for Discharge 03/25/2023 1341 by Donne Hazel, LPN Outcome: Progressing 03/25/2023 0957 by Donne Hazel, LPN Outcome: Progressing   Problem: Pain Management: Goal: General experience of comfort will improve 03/25/2023 1341 by Donne Hazel, LPN Outcome: Adequate for Discharge 03/25/2023 1341 by Donne Hazel, LPN Outcome: Progressing 03/25/2023 0957 by Donne Hazel, LPN Outcome: Progressing   Problem: Safety: Goal: Ability to remain free from injury will improve 03/25/2023 1341 by Donne Hazel, LPN Outcome: Adequate for Discharge 03/25/2023 1341 by Donne Hazel, LPN Outcome: Progressing 03/25/2023 0957 by Donne Hazel, LPN Outcome: Progressing   Problem: Skin Integrity: Goal: Risk for impaired skin integrity will decrease 03/25/2023 1341 by Donne Hazel, LPN Outcome: Adequate for Discharge 03/25/2023 1341 by Donne Hazel, LPN Outcome: Progressing 03/25/2023 0957 by Donne Hazel, LPN Outcome: Progressing   Problem: Education: Goal: Knowledge of Childbirth will improve 03/25/2023 1341 by Donne Hazel, LPN Outcome: Adequate for Discharge 03/25/2023 1341 by Donne Hazel, LPN Outcome: Progressing 03/25/2023 0957 by Donne Hazel, LPN Outcome: Progressing Goal: Ability to make informed decisions regarding treatment and plan of care will improve 03/25/2023 1341 by Donne Hazel, LPN Outcome: Adequate for Discharge 03/25/2023 1341 by Donne Hazel, LPN Outcome: Progressing 03/25/2023 0957 by Donne Hazel, LPN Outcome: Progressing Goal: Ability to state and carry out methods to decrease the pain will improve 03/25/2023 1341 by Donne Hazel, LPN Outcome: Adequate for Discharge 03/25/2023 1341 by Donne Hazel, LPN Outcome: Progressing 03/25/2023 0957 by Donne Hazel, LPN Outcome: Progressing Goal: Individualized Educational Video(s) 03/25/2023 1341 by Donne Hazel, LPN Outcome: Adequate  for Discharge 03/25/2023 1341 by Donne Hazel, LPN Outcome: Progressing 03/25/2023 0957 by Donne Hazel, LPN Outcome: Progressing   Problem: Coping: Goal: Ability to verbalize concerns  and feelings about labor and delivery will improve 03/25/2023 1341 by Donne Hazel, LPN Outcome: Adequate for Discharge 03/25/2023 1341 by Donne Hazel, LPN Outcome: Progressing 03/25/2023 0957 by Donne Hazel, LPN Outcome: Progressing   Problem: Life Cycle: Goal: Ability to make normal progression through stages of labor will improve 03/25/2023 1341 by Donne Hazel, LPN Outcome: Adequate for Discharge 03/25/2023 1341 by Donne Hazel, LPN Outcome: Progressing 03/25/2023 0957 by Donne Hazel, LPN Outcome: Progressing Goal: Ability to effectively push during vaginal delivery will improve 03/25/2023 1341 by Donne Hazel, LPN Outcome: Adequate for Discharge 03/25/2023 1341 by Donne Hazel, LPN Outcome: Progressing 03/25/2023 0957 by Donne Hazel, LPN Outcome: Progressing   Problem: Role Relationship: Goal: Will demonstrate positive interactions with the child 03/25/2023 1341 by Donne Hazel, LPN Outcome: Adequate for Discharge 03/25/2023 1341 by Donne Hazel, LPN Outcome: Progressing 03/25/2023 0957 by Donne Hazel, LPN Outcome: Progressing   Problem: Safety: Goal: Risk of complications during labor and delivery will decrease 03/25/2023 1341 by Donne Hazel, LPN Outcome: Adequate for Discharge 03/25/2023 1341 by Donne Hazel, LPN Outcome: Progressing 03/25/2023 0957 by Donne Hazel, LPN Outcome: Progressing   Problem: Pain Management: Goal: Relief or control of pain from uterine contractions will improve 03/25/2023 1341 by Donne Hazel, LPN Outcome: Adequate for Discharge 03/25/2023 1341 by Donne Hazel, LPN Outcome: Progressing 03/25/2023 0957 by Donne Hazel, LPN Outcome: Progressing   Problem: Education: Goal: Knowledge of condition will  improve 03/25/2023 1341 by Donne Hazel, LPN Outcome: Adequate for Discharge 03/25/2023 1341 by Donne Hazel, LPN Outcome: Progressing 03/25/2023 0957 by Donne Hazel, LPN Outcome: Progressing Goal: Individualized Educational Video(s) 03/25/2023 1341 by Donne Hazel, LPN Outcome: Adequate for Discharge 03/25/2023 1341 by Donne Hazel, LPN Outcome: Progressing 03/25/2023 0957 by Donne Hazel, LPN Outcome: Progressing Goal: Individualized Newborn Educational Video(s) 03/25/2023 1341 by Donne Hazel, LPN Outcome: Adequate for Discharge 03/25/2023 1341 by Donne Hazel, LPN Outcome: Progressing 03/25/2023 0957 by Donne Hazel, LPN Outcome: Progressing   Problem: Coping: Goal: Ability to identify and utilize available resources and services will improve 03/25/2023 1341 by Donne Hazel, LPN Outcome: Adequate for Discharge 03/25/2023 1341 by Donne Hazel, LPN Outcome: Progressing 03/25/2023 0957 by Donne Hazel, LPN Outcome: Progressing   Problem: Life Cycle: Goal: Chance of risk for complications during the postpartum period will decrease 03/25/2023 1341 by Donne Hazel, LPN Outcome: Adequate for Discharge 03/25/2023 1341 by Donne Hazel, LPN Outcome: Progressing 03/25/2023 0957 by Donne Hazel, LPN Outcome: Progressing   Problem: Role Relationship: Goal: Ability to demonstrate positive interaction with newborn will improve 03/25/2023 1341 by Donne Hazel, LPN Outcome: Adequate for Discharge 03/25/2023 1341 by Donne Hazel, LPN Outcome: Progressing 03/25/2023 0957 by Donne Hazel, LPN Outcome: Progressing   Problem: Skin Integrity: Goal: Demonstration of wound healing without infection will improve 03/25/2023 1341 by Donne Hazel, LPN Outcome: Adequate for Discharge 03/25/2023 1341 by Donne Hazel, LPN Outcome: Progressing 03/25/2023 0957 by Donne Hazel, LPN Outcome: Progressing

## 2023-03-25 NOTE — Lactation Note (Signed)
This note was copied from a baby's chart. Lactation Consultation Note  Patient Name: [name] [id number] Date: 03/25/2023 Age:37 hours  Reason for consult: Follow-up assessment;Early term 37-38.6wks  P2, [redacted]w[redacted]d, 3.41% weight loss  Birth Mother states she may have to have a blood transfusion and she is feeling tired. She reports are breast felt full last night and the LC helped her to relieve firm area. Today, mother's breasts are soft and non-tender. Baby has been getting donor breast milk and mother has pumped 2-3 times. Last pumping, she expressed a few drops. Encouraged to latch baby with feeding cues, pump every 3 hours to stimulate milk production and place baby skin to skin in efforts to produce milk. Mother to call for assistance as needed. Mother received a phone call while LC was in the room to assist. [name]'s other mother fed him DBM by bottel.    Discussed the process of milk production, "supply and demand" and the importance of breast stimulation and milk removal in order to make an optimal milk supply.  Discussed mother to breastfeed 8-12 times in 24 hours, skin to skin and breast feed before formula feeding. If missed feedings at breast or substituting feeding with formula, advised to hand express and/or pump to remove milk from the breast.     Feeding Mother's Current Feeding Choice: Breast Milk and Donor Milk Nipple Type: Nfant Standard Flow (white)     Interventions Interventions: Education, mother has a DEBP in the room  Discharge Pump:  (planning to purchase)  Consult Status Consult Status: Follow-up Date: 03/26/23    Omar Person 03/25/2023, 1:20 PM

## 2023-03-25 NOTE — Plan of Care (Signed)
  Problem: Health Behavior/Discharge Planning: Goal: Ability to manage health-related needs will improve Outcome: Progressing   Problem: Clinical Measurements: Goal: Ability to maintain clinical measurements within normal limits will improve Outcome: Progressing Goal: Will remain free from infection Outcome: Progressing Goal: Diagnostic test results will improve Outcome: Progressing Goal: Respiratory complications will improve Outcome: Progressing Goal: Cardiovascular complication will be avoided Outcome: Progressing   Problem: Coping: Goal: Level of anxiety will decrease Outcome: Progressing   Problem: Elimination: Goal: Will not experience complications related to bowel motility Outcome: Progressing   Problem: Pain Management: Goal: General experience of comfort will improve Outcome: Progressing   Problem: Safety: Goal: Ability to remain free from injury will improve Outcome: Progressing   Problem: Skin Integrity: Goal: Risk for impaired skin integrity will decrease Outcome: Progressing   Problem: Education: Goal: Knowledge of Childbirth will improve Outcome: Progressing Goal: Ability to make informed decisions regarding treatment and plan of care will improve Outcome: Progressing Goal: Ability to state and carry out methods to decrease the pain will improve Outcome: Progressing Goal: Individualized Educational Video(s) Outcome: Progressing   Problem: Coping: Goal: Ability to verbalize concerns and feelings about labor and delivery will improve Outcome: Progressing   Problem: Life Cycle: Goal: Ability to make normal progression through stages of labor will improve Outcome: Progressing Goal: Ability to effectively push during vaginal delivery will improve Outcome: Progressing   Problem: Role Relationship: Goal: Will demonstrate positive interactions with the child Outcome: Progressing   Problem: Safety: Goal: Risk of complications during labor and  delivery will decrease Outcome: Progressing   Problem: Pain Management: Goal: Relief or control of pain from uterine contractions will improve Outcome: Progressing   Problem: Education: Goal: Knowledge of condition will improve Outcome: Progressing Goal: Individualized Educational Video(s) Outcome: Progressing Goal: Individualized Newborn Educational Video(s) Outcome: Progressing   Problem: Coping: Goal: Ability to identify and utilize available resources and services will improve Outcome: Progressing   Problem: Life Cycle: Goal: Chance of risk for complications during the postpartum period will decrease Outcome: Progressing   Problem: Role Relationship: Goal: Ability to demonstrate positive interaction with newborn will improve Outcome: Progressing   Problem: Skin Integrity: Goal: Demonstration of wound healing without infection will improve Outcome: Progressing

## 2023-03-25 NOTE — Lactation Note (Addendum)
This note was copied from a baby's chart. Lactation Consultation Note  Patient Name: Boy Panagiota Faul [id number] Date: 03/25/2023 Age:37 hours Reason for consult: Mother's request;Early term 37-38.6wks.  MOB requested LC services due to lumps in her breast. LC observed MOB has areola edema and MOB milk is starting to come. MOB made 4 latch attempts today to latch infant but infant is not interested in latching at this time. MOB is supplementing infant with donor breast milk. MOB used DEBP maybe twice. LC discussed with MOB to pump every 3 hours for 15 minutes on initial setting to help stimulate and establish her milk supply. LC reviewed hand expression, using hand pump prior to DEBP and MOB expressed 5 mls of colostrum from left breast only. LC suggested pumping both breast at one time, when [facility] entered the room, MOB was pumping only her left breast. MOB plans to latch infant at the next feeding, afterwards will offer her 5 mls or more of EBM first and then donor breast milk. LC reviewed not to wear breast shells when sleeping.  Current feeding plan:  Mob knows that her EBM is safe for 4 hours whereas donor breast milk once warmed must be used within 1 hour.  1- MOB knows to pre-pump breast prior to latching infant, MOB will attempt to latch infant at the breast for every feeding, if infant does not latch after 5 minutes, supplement infant with donor breast milk Day 2  ( 15-30 mls) per feeding and afterwards use DEBP. 2- MOB will ask for latch assistance if needed. 3- MOB will pump both breast every 3 hours for 15 minutes and give infant back any EBM 1st before donor breast milk.   Maternal Data    Feeding Mother's Current Feeding Choice: Breast Milk and Donor Milk Nipple Type: Nfant Standard Flow (white)  LATCH Score  LC could not assist with latch due infant recently receiving 15 mls of donor milk and currently asleep in basinet.                   Lactation Tools  Discussed/Used Flange Size: 21 Breast pump type: Double-Electric Breast Pump Reason for Pumping: Infant ETI, MOB using donor breast milk. Pumping frequency: MOB will continue to use DEBP every 3 hours for 15 minutes. Pumped volume: 5 mL (still pumping when LC left the room.)  Interventions    Discharge    Consult Status Consult Status: Follow-up Date: 03/26/23 Follow-up type: In-patient    Frederico Hamman 03/25/2023, 1:39 AM

## 2023-03-25 NOTE — Progress Notes (Signed)
Pt c/o 7/10 tightness in left upper chest that woke her from sleep. States that is similar to pain she had earlier in afternoon but more intense. Feels the pain in general but area is painful to touch as well. VSS. Called Dr. Lucianne Muss and reviewed pt's complaints. Dr. Lucianne Muss to place order for labs and EKG. NICU RT called for EKG to be done. Pt updated on plan of care.

## 2023-03-27 ENCOUNTER — Ambulatory Visit: Payer: Medicaid Other

## 2023-03-29 ENCOUNTER — Ambulatory Visit: Payer: Medicaid Other

## 2023-04-01 ENCOUNTER — Ambulatory Visit: Payer: Medicaid Other

## 2023-04-01 ENCOUNTER — Encounter: Payer: Self-pay | Admitting: Certified Nurse Midwife

## 2023-04-02 ENCOUNTER — Telehealth (HOSPITAL_COMMUNITY): Payer: Self-pay

## 2023-04-02 NOTE — Telephone Encounter (Signed)
04/02/2023 1537  Name: [name] MRN: [id number] DOB: [birth year]  Reason for Call:  Transition of Care Hospital Discharge Call  Contact Status: Patient Contact Status: Complete  Language assistant needed:          Follow-Up Questions: Do You Have Any Concerns About Your Health As You Heal From Delivery?: Yes What Concerns Do You Have About Your Health?: Patient reports that after pumping she has bad cramps and a gush of lochia. RN explained uterine involution and cramping. RN also reviewed what to much bleeding looks like and when to call her OB-GYN. RN reviewed keeping an empty bladder, using a heating pad, and taking prescribed motrin to help with cramps. Patient also concerned about being anemic. Patient declines dizzieness or feeling lightheaded but states that she does feel weak and tired. RN told patient to reach out to her OB about her concern. Do You Have Any Concerns About Your Infants Health?: No RN also talked with patient about eating well, drinking plenty of fluids, and prioritizing rest.  Inocente Salles Postnatal Depression Scale:  In the Past 7 Days: I have been able to laugh and see the funny side of things.: As much as I always could I have looked forward with enjoyment to things.: As much as I ever did I have blamed myself unnecessarily when things went wrong.: No, never I have been anxious or worried for no good reason.: No, not at all I have felt scared or panicky for no good reason.: No, not at all Things have been getting on top of me.: No, most of the time I have coped quite well I have been so unhappy that I have had difficulty sleeping.: Not at all I have felt sad or miserable.: Not very often I have been so unhappy that I have been crying.: No, never The thought of harming myself has occurred to me.: Never Edinburgh Postnatal Depression Scale Total: 2  PHQ2-9 Depression Scale:     Discharge Follow-up: Edinburgh score requires follow up?: No Patient was  advised of the following resources:: Breastfeeding Support Group, Support Group  Post-discharge interventions: Reviewed Newborn Safe Sleep Practices  Signature  Signe Colt

## 2023-04-08 ENCOUNTER — Ambulatory Visit: Payer: Medicaid Other | Admitting: *Deleted

## 2023-04-08 ENCOUNTER — Other Ambulatory Visit: Payer: Self-pay

## 2023-04-08 VITALS — BP 109/79 | HR 81 | Ht 71.0 in | Wt 175.0 lb

## 2023-04-08 DIAGNOSIS — E538 Deficiency of other specified B group vitamins: Secondary | ICD-10-CM | POA: Diagnosis not present

## 2023-04-08 DIAGNOSIS — D649 Anemia, unspecified: Secondary | ICD-10-CM

## 2023-04-08 MED ORDER — CYANOCOBALAMIN 1000 MCG/ML IJ SOLN
1000.0000 ug | Freq: Once | INTRAMUSCULAR | Status: AC
  Administered 2023-04-08: 1000 ug via INTRAMUSCULAR

## 2023-04-08 NOTE — Progress Notes (Signed)
Here for bp check s/p vaginal delivery 03/23/23 and developed pre-eclampsia postpartum. States she never finished lasix due to too frequent urination and trouble with milk supply. States stopped bp pill after first week. States has checked bp once since stopped med and it was normal, can't remember exact number. BP wnl. Advised may continue not taking Nifedipine but should check BP at least once a week and sooner if severe headache, edema and restart if elevated. Discussed signs of pre-eclampsia. She reports she is supposed to get Vitamin B12 today. Discussed plan  with Dr. Nobie Putnam and  he agrees with plan and advised should take iron every other day. Also approved Vitamin B12 injection. Injection given without complaint and advised to take iron every other day. She voices understanding with plan of care. Also reviewed postpartum appointment with her. Nancy Fetter

## 2023-04-11 ENCOUNTER — Inpatient Hospital Stay (HOSPITAL_COMMUNITY): Admit: 2023-04-11 | Payer: Medicaid Other

## 2023-04-23 ENCOUNTER — Encounter: Payer: Self-pay | Admitting: Obstetrics and Gynecology

## 2023-05-07 ENCOUNTER — Other Ambulatory Visit: Payer: Self-pay

## 2023-05-07 ENCOUNTER — Ambulatory Visit: Payer: Medicaid Other | Admitting: Certified Nurse Midwife

## 2023-05-07 ENCOUNTER — Encounter: Payer: Self-pay | Admitting: Certified Nurse Midwife

## 2023-05-07 DIAGNOSIS — F419 Anxiety disorder, unspecified: Secondary | ICD-10-CM

## 2023-05-07 DIAGNOSIS — O9270 Unspecified disorders of lactation: Secondary | ICD-10-CM

## 2023-05-07 DIAGNOSIS — Z1332 Encounter for screening for maternal depression: Secondary | ICD-10-CM

## 2023-05-07 DIAGNOSIS — R32 Unspecified urinary incontinence: Secondary | ICD-10-CM

## 2023-05-07 MED ORDER — HYDROXYZINE HCL 25 MG PO TABS: 25.0000 mg | ORAL_TABLET | Freq: Four times a day (QID) | ORAL | 1 refills | Status: DC | PRN

## 2023-05-07 NOTE — Progress Notes (Signed)
 Post Partum Visit Note  [name] is a 37 y.o. [phone] female who presents for a postpartum visit. She is 6 weeks postpartum following a normal spontaneous vaginal delivery.  I have fully reviewed the prenatal and intrapartum course. The delivery was at [redacted]w[redacted]d gestational weeks.  Anesthesia: local. Postpartum course has been good, overall. Baby is doing well. Baby is feeding by both breast and bottle - bobbie . Bleeding staining only. Bowel function is normal. Bladder function is normal. Patient is not sexually active. Contraception method is none. Postpartum depression screening: negative.  Reports that she is having lower extremities swell at the end of the day.  Reports pelvic pressure, and an episode of sneezing. Occasional loss of urine when bladder very full.  Concerns about milk supply. Reports that she has milk, is feeling fullness of breasts, but milk is not coming out. On further investigation, patient feels very anxious about pumping.     Upstream - 05/07/23 1330       Pregnancy Intention Screening   Does the patient want to become pregnant in the next year? No    Does the patient's partner want to become pregnant in the next year? No    Would the patient like to discuss contraceptive options today? No      Contraception Wrap Up   Current Method No Contraceptive Precautions    End Method No Contraception Precautions    Contraception Counseling Provided No            The pregnancy intention screening data noted above was reviewed. Potential methods of contraception were discussed. The patient elected to proceed with No Contraception Precautions.   Edinburgh Postnatal Depression Scale - 05/07/23 1330       Edinburgh Postnatal Depression Scale:  In the Past 7 Days   I have been able to laugh and see the funny side of things. 0    I have looked forward with enjoyment to things. 0    I have blamed myself unnecessarily when things went wrong. 0    I have been  anxious or worried for no good reason. 0    I have felt scared or panicky for no good reason. 0    Things have been getting on top of me. 1    I have been so unhappy that I have had difficulty sleeping. 0    I have felt sad or miserable. 0    I have been so unhappy that I have been crying. 0    The thought of harming myself has occurred to me. 0    Edinburgh Postnatal Depression Scale Total 1             Health Maintenance Due  Topic Date Due   INFLUENZA VACCINE  Never done   COVID-19 Vaccine (1 - 2024-25 season) Never done    The following portions of the patient's history were reviewed and updated as appropriate: allergies, current medications, past family history, past medical history, past social history, past surgical history, and problem list.  Review of Systems Pertinent items are noted in HPI.  Objective:  BP 110/75   Pulse 69   Wt 171 lb (77.6 kg)   Breastfeeding Yes   BMI 23.85 kg/m    General:  alert, cooperative, and appears stated age   Breasts:  normal, lactating  Lungs: clear to auscultation bilaterally  Heart:  regular rate and rhythm, S1, S2 normal, no murmur, click, rub or gallop  Abdomen: soft,  non-tender; bowel sounds normal; no masses,  no organomegaly   Wound well approximated   GU exam:  not indicated       Assessment:    1. Postpartum examination following vaginal delivery - Routine PP care, over all well.  - Referral sent to PFT.   2. Anxiety - Atarax  trial, if herbal remedies not useful. Provided with herbal information in MyChart message. - Encouraged self-care.  3. Lactation problem. - Is having trouble with let-down. Encouraged herbal support for mood.  - Referred to [facility] lact[facility]   Benign postpartum exam.   Plan:   Essential components of care per ACOG recommendations:  1.  Mood and well being: Patient with positive depression screening today. Reviewed local resources for support.  - Patient tobacco use? No.   - hx of drug  use? No.    2. Infant care and feeding:  -Patient currently breastmilk feeding? Yes. Discussed returning to work and pumping. Reviewed importance of draining breast regularly to support lactation.  -Social determinants of health (SDOH) reviewed in EPIC. No concerns  3. Sexuality, contraception and birth spacing - Patient does not want a pregnancy in the next year.  Desired family size is 2 children.  - Reviewed reproductive life planning. Reviewed contraceptive methods based on pt preferences and effectiveness.  Patient desired no contraceptive today.  Patient in same-sex relationship. - Discussed birth spacing of 18 months  4. Sleep and fatigue -Encouraged family/partner/community support of 4 hrs of uninterrupted sleep to help with mood and fatigue  5. Physical Recovery  - Discussed patients delivery and complications. She describes her labor as good. - Patient had a Vaginal, no problems at delivery. Patient had a 2nd degree laceration. Perineal healing reviewed. Patient expressed understanding - Patient has urinary incontinence? Yes. Offered PT and patient accepted. Patient was referred to pelvic floor PT.  - Patient is safe to resume physical and sexual activity  6.  Health Maintenance - HM due items addressed Yes - Last pap smear  Diagnosis  Date Value Ref Range Status  11/30/2022   Final   - Negative for intraepithelial lesion or malignancy (NILM)   Pap smear not done at today's visit.  -Breast Cancer screening indicated? No.   7. Chronic Disease/Pregnancy Condition follow up: None  - PCP follow up  - Annual exam in 1 year or sooner as needed.   Camie DELENA Rote, CNM [facility], [facility]

## 2023-07-03 ENCOUNTER — Other Ambulatory Visit: Payer: Self-pay

## 2023-07-03 ENCOUNTER — Encounter: Payer: Self-pay | Admitting: Physical Therapy

## 2023-07-03 ENCOUNTER — Ambulatory Visit: Payer: Medicaid Other | Attending: Certified Nurse Midwife | Admitting: Physical Therapy

## 2023-07-03 DIAGNOSIS — R293 Abnormal posture: Secondary | ICD-10-CM | POA: Insufficient documentation

## 2023-07-03 DIAGNOSIS — R32 Unspecified urinary incontinence: Secondary | ICD-10-CM | POA: Diagnosis not present

## 2023-07-03 DIAGNOSIS — M6281 Muscle weakness (generalized): Secondary | ICD-10-CM | POA: Diagnosis present

## 2023-07-03 DIAGNOSIS — R279 Unspecified lack of coordination: Secondary | ICD-10-CM | POA: Diagnosis present

## 2023-07-03 NOTE — Patient Instructions (Signed)

## 2023-07-03 NOTE — Therapy (Signed)
 OUTPATIENT PHYSICAL THERAPY FEMALE PELVIC EVALUATION   Patient Name: [name] MRN: [id number] DOB:1985/06/22, 38 y.o., female Today's Date: 07/03/2023  END OF SESSION:  PT End of Session - 07/03/23 1549     Visit Number 1    Number of Visits 8    Date for PT Re-Evaluation 07/31/23    Authorization Type Medicaid [employer]    Authorization - Visit Number 1    Authorization - Number of Visits 27    PT Start Time 1400    PT Stop Time 1445    PT Time Calculation (min) 45 min             Past Medical History:  Diagnosis Date   Medical history non-contributory    Past Surgical History:  Procedure Laterality Date   DILATION AND CURETTAGE OF UTERUS     Patient Active Problem List   Diagnosis Date Noted   SVD (spontaneous vaginal delivery) 03/24/2023   Vaginal delivery 03/23/2023   Obstetric vaginal laceration 03/23/2023   Anemia complicating pregnancy 01/25/2023   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 12/28/2022   Alpha thalassemia silent carrier 12/28/2022   Fibroid uterus 12/28/2022   PCOS (polycystic ovarian syndrome) 11/30/2022   Old cervical tear 11/30/2022   Supervision of high risk pregnancy, antepartum 11/22/2022   AMA (advanced maternal age) multigravida 35+ 11/22/2022    PCP: Carlynn Herald, CNM  REFERRING PROVIDER: Richardson Landry, CNM   REFERRING DIAG: Z39.2 (ICD-10-CM) - Postpartum examination following vaginal delivery R32 (ICD-10-CM) - Urinary incontinence, unspecified type  THERAPY DIAG:  Muscle weakness (generalized)  Unspecified lack of coordination  Abnormal posture  Rationale for Evaluation and Treatment: Rehabilitation  ONSET DATE: 03/23/23  SUBJECTIVE:                                                                                                                                                                                           SUBJECTIVE STATEMENT: Patient reports to PT after delivering her baby 03/23/23 vaginally (some  internal tearing with stitches) to address postpartum weakness/pain/incontinence. She has been having pain in her back and legs after pumping as well. Her symptoms have improved slightly since giving birth, but she will consistently feel lumbopelvic pain with ADLs. Fluid intake: water intake - ask about next visit   PAIN:  Are you having pain? Yes NPRS scale: 7/10 at worst, 5/10 currently  Pain location: Internal, Deep, Bilateral, Anterior, and Posterior  Pain type: aching Pain description: intermittent and dull   Aggravating factors: standing at work, pumping Relieving factors: resting, heat   PRECAUTIONS: None  RED FLAGS: None   WEIGHT BEARING RESTRICTIONS: No  FALLS:  Has patient fallen in last 6 months? 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   PLOF: Independent  PATIENT GOALS: decreased pain, get stronger  PERTINENT HISTORY:  2 vaginal deliveries  Sexual abuse: No  BOWEL MOVEMENT: Pain with bowel movement: Yes Type of bowel movement:Type (Bristol Stool Scale) 4, Frequency 1-2, Strain sometimes, and Splinting no Fully empty rectum: Yes:   Leakage: No Pads: No Fiber supplement/laxative Yes - stool softener   URINATION: Pain with urination: No Fully empty bladder: Yes:   Stream: Strong Urgency: Yes  Frequency: WNL Leakage: Urge to void and Sneezing Pads: No  INTERCOURSE:  Ability to have vaginal penetration  hasn't attempted intercourse yet  Pain with intercourse:  N/A DrynessNo Climax: usually yes Marinoff Scale: 0/3  PREGNANCY: Vaginal deliveries 2 Tearing Yes: tearing with both that healed well  C-section deliveries 0 Currently pregnant No  PROLAPSE: Pressure with pooping   OBJECTIVE:  Note: Objective measures were completed at Evaluation unless otherwise noted.  PATIENT SURVEYS:   PFIQ-7:  48  COGNITION: Overall cognitive status: Within functional limits for tasks assessed     SENSATION: Light touch: Appears intact  LUMBAR SPECIAL TESTS:  Single leg stance test: Positive  FUNCTIONAL TESTS:  Squat: lumbopelvic pain and feelings of weakness with transferring   GAIT: Comments: mild trendelenburg gait pattern with ambulation   POSTURE: rounded shoulders, forward head, and increased lumbar lordosis  LUMBARAROM/PROM:   A/PROM A/PROM  eval  Flexion 25% limited  Extension 25% limtied  Right lateral flexion 25% limited  Left lateral flexion 25% limited  Right rotation 25% limited  Left rotation 25% limited   (Blank rows = not tested)  LOWER EXTREMITY ROM: WNL  Active ROM Right eval Left eval  Hip flexion    Hip extension    Hip abduction    Hip adduction    Hip internal rotation    Hip external rotation    Knee flexion    Knee extension    Ankle dorsiflexion    Ankle plantarflexion    Ankle inversion    Ankle eversion     (Blank rows = not tested)  LOWER EXTREMITY MMT: 4-/5 bilateral knees grossly, 4/5 bilateral hips grossly   MMT Right eval Left eval  Hip flexion    Hip extension    Hip abduction    Hip adduction    Hip internal rotation    Hip external rotation    Knee flexion    Knee extension    Ankle dorsiflexion    Ankle plantarflexion    Ankle inversion    Ankle eversion     (Blank rows = not tested) PALPATION:   General: bilateral adductors and hip flexors were not TTP   Pelvic Alignment: WNL  Abdominal: upper chest breathing, decreased rib excursion with breathing                External Perineal Exam: minimal dryness noted. No significant perineal dropping with cough test.                             Internal Pelvic Floor: generally weak throughout pelvic floor with no pain during examination. Decreased pelvic floor AROM with inhalation/exhalation.   Patient confirms identification and approves PT to assess internal pelvic  floor and treatment  Yes No emotional/communication barriers or cognitive limitation. Patient is motivated to learn. Patient understands and agrees with treatment goals and plan. PT explains patient will be examined in standing, sitting, and lying down to see how their muscles and joints work. When they are ready, they will be asked to remove their underwear so PT can examine their perineum. The patient is also given the option of providing their own chaperone as one is not provided in our facility. The patient also has the right and is explained the right to defer or refuse any part of the evaluation or treatment including the internal exam. With the patient's consent, PT will use one gloved finger to gently assess the muscles of the pelvic floor, seeing how well it contracts and relaxes and if there is muscle symmetry. After, the patient will get dressed and PT and patient will discuss exam findings and plan of care. PT and patient discuss plan of care, schedule, attendance policy and HEP activities.  PELVIC MMT:   MMT eval  Vaginal 3/5, 3 sec hold, 10 quick flicks   Internal Anal Sphincter   External Anal Sphincter   Puborectalis   Diastasis Recti   (Blank rows = not tested)       TONE: WNL  PROLAPSE: N/A  TODAY'S TREATMENT:                                                                                                                              DATE:   EVAL 07/03/23: Examination completed, findings reviewed, pt educated on POC, HEP, and self care. Pt motivated to participate in PT and agreeable to attempt recommendations.  Self care: Relative anatomy, connection between pelvic floor and diaphragm, diaphragmatic breathing and abdominal excursion, vaginal moisturizer list and lubrication education (provided two water based lubrication samples) Neuro re-ed: Hooklying diaphragmatic breathing + pelvic floor lengthening with inhalation and shortening with exhalation 3x10  Hooklying pelvic  floor muscle quick flicks + diaphragmatic breathing 2x10   PATIENT EDUCATION:  Education details: Relative anatomy, connection between pelvic floor and diaphragm, diaphragmatic breathing and abdominal excursion, vaginal moisturizer list and lubrication education (provided two water based lubrication samples) Person educated: Patient Education method: Explanation, Demonstration, Tactile cues, Verbal cues, and Handouts Education comprehension: verbalized understanding, returned demonstration, verbal cues required, and tactile cues requiredv  HOME EXERCISE PROGRAM: Access Code: [email or web address] URL: [email or web address] Date: 07/03/2023 Prepared by: Earna Coder  Exercises - Supine Pelvic Floor Contraction  - 1 x daily - 7 x weekly - 3 sets - 10 reps - Quick Flick Pelvic Floor Contractions in Hooklying  - 1 x daily - 7 x weekly - 3 sets - 10 reps  ASSESSMENT:  CLINICAL IMPRESSION: Patient is a 38 y.o. female who was seen today for physical therapy evaluation and treatment for postpartum-related weakness, pain, and stress urinary incontinence. Patient delivered her second child vaginally 03/25/23 and had tearing with stitches internally. Since delivering, patient has not had intercourse and has  not returned to exercise, although she would like to. Patient fully consented to today's internal examination which revealed generalized weakness in the pelvic floor along with a lack of coordination between the pelvic floor and the diaphragm. With internal cueing, patient was able to actively lengthen and shorten her pelvic floor musculature with breathing. No pain following today's examination. Pt would benefit from additional PT to further address deficits.     OBJECTIVE IMPAIRMENTS: decreased coordination, decreased endurance, decreased mobility, decreased ROM, decreased strength, and pain.   ACTIVITY LIMITATIONS: continence  PARTICIPATION LIMITATIONS:  exercise including pilates, vaginal  penetration due to pain   PERSONAL FACTORS: Past/current experiences are also affecting patient's functional outcome.   REHAB POTENTIAL: Good  CLINICAL DECISION MAKING: Stable/uncomplicated  EVALUATION COMPLEXITY: Low   GOALS: Goals reviewed with patient? Yes  SHORT TERM GOALS: Target date: 07/31/2023  Pt will be independent with HEP.  Baseline: Goal status: INITIAL  2.  Pt will be independent with diaphragmatic breathing and down training activities in order to improve pelvic floor relaxation. Baseline:  Goal status: INITIAL  3.  Pt will be independent with the knack, urge suppression technique, and double voiding in order to improve bladder habits and decrease urinary incontinence.   Baseline:  Goal status: INITIAL  LONG TERM GOALS: Target date: 12/31/2023  Pt will be independent with advanced HEP.  Baseline:  Goal status: INITIAL  2.  Pt will report 75% reduction of pain due to improvements in posture, strength, and muscle length to improve quality of life and functional mobility. Baseline:  Goal status: INITIAL  3.  Patient will report PFIQ-7 score of 24 points or less to suggest decreased functional limitations secondary to pelvic pain to improve quality of life. Baseline:  Goal status: INITIAL  4.  Pt will demonstrate normal pelvic floor muscle tone and A/ROM, able to achieve 4/5 strength with contractions and 10 sec endurance, in order to provide appropriate lumbopelvic support in functional activities.   Baseline:  Goal status: INITIAL  PLAN:  PT FREQUENCY: 1x/week  PT DURATION: 8 weeks  PLANNED INTERVENTIONS: 97110-Therapeutic exercises, 97530- Therapeutic activity, 97112- Neuromuscular re-education, 97535- Self Care, [id number]- Manual therapy, Taping, Dry Needling, Joint mobilization, Spinal mobilization, Scar mobilization, Cryotherapy, and Moist heat  PLAN FOR NEXT SESSION: see how pain has been and perform internal for pain relief, progress pelvic floor  AROM to seated position, continued lumbopelvic mobility, introduce LE strengthening   Omar Person, PT 07/03/2023, 3:50 PM

## 2023-07-25 ENCOUNTER — Ambulatory Visit: Payer: Medicaid Other | Attending: Certified Nurse Midwife | Admitting: Physical Therapy

## 2023-07-25 DIAGNOSIS — M6281 Muscle weakness (generalized): Secondary | ICD-10-CM | POA: Diagnosis present

## 2023-07-25 DIAGNOSIS — R293 Abnormal posture: Secondary | ICD-10-CM | POA: Diagnosis present

## 2023-07-25 DIAGNOSIS — R279 Unspecified lack of coordination: Secondary | ICD-10-CM | POA: Insufficient documentation

## 2023-07-25 NOTE — Therapy (Signed)
 OUTPATIENT PHYSICAL THERAPY FEMALE PELVIC TREATMENT   Patient Name: [name] MRN: [id number] DOB:Dec 10, 1985, 38 y.o., female Today's Date: 07/25/2023  END OF SESSION:  PT End of Session - 07/25/23 1657     Visit Number 2    Number of Visits 8    Date for PT Re-Evaluation 07/31/23    Authorization Type Medicaid [employer]    Authorization Time Period [facility] Approved 4 visits-07/03/2023-09/01/2023 auth#[id number]    Authorization - Number of Visits 4    PT Start Time 0419    PT Stop Time 0457    PT Time Calculation (min) 38 min    Activity Tolerance Patient tolerated treatment well    Behavior During Therapy WFL for tasks assessed/performed              Past Medical History:  Diagnosis Date   Medical history non-contributory    Past Surgical History:  Procedure Laterality Date   DILATION AND CURETTAGE OF UTERUS     Patient Active Problem List   Diagnosis Date Noted   SVD (spontaneous vaginal delivery) 03/24/2023   Vaginal delivery 03/23/2023   Obstetric vaginal laceration 03/23/2023   Anemia complicating pregnancy 01/25/2023   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 12/28/2022   Alpha thalassemia silent carrier 12/28/2022   Fibroid uterus 12/28/2022   PCOS (polycystic ovarian syndrome) 11/30/2022   Old cervical tear 11/30/2022   Supervision of high risk pregnancy, antepartum 11/22/2022   AMA (advanced maternal age) multigravida 35+ 11/22/2022    PCP: Carlynn Herald, CNM  REFERRING PROVIDER: Richardson Landry, CNM   REFERRING DIAG: Z39.2 (ICD-10-CM) - Postpartum examination following vaginal delivery R32 (ICD-10-CM) - Urinary incontinence, unspecified type  THERAPY DIAG:  Muscle weakness (generalized)  Unspecified lack of coordination  Abnormal posture  Rationale for Evaluation and Treatment: Rehabilitation  ONSET DATE: 03/23/23  SUBJECTIVE:                                                                                                                                                                                            SUBJECTIVE STATEMENT: 5-6/10 pain currently in the lower abdominal/pelvis region. Leakage is getting better.   PAIN:  Are you having pain? Yes NPRS scale: 7/10 at worst, 5/10 currently  Pain location: Internal, Deep, Bilateral, Anterior, and Posterior  Pain type: aching Pain description: intermittent and dull   Aggravating factors: standing at work, pumping Relieving factors: resting, heat   PRECAUTIONS: None  RED FLAGS: None   WEIGHT BEARING RESTRICTIONS: No  FALLS:  Has patient fallen in last 6 months? 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   PLOF: Independent  PATIENT GOALS: decreased pain, get stronger  PERTINENT HISTORY:  2 vaginal deliveries  Sexual abuse: No  BOWEL MOVEMENT: Pain with bowel movement: Yes Type of bowel movement:Type (Bristol Stool Scale) 4, Frequency 1-2, Strain sometimes, and Splinting no Fully empty rectum: Yes:   Leakage: No Pads: No Fiber supplement/laxative Yes - stool softener   URINATION: Pain with urination: No Fully empty bladder: Yes:   Stream: Strong Urgency: Yes  Frequency: WNL Leakage: Urge to void and Sneezing Pads: No  INTERCOURSE:  Ability to have vaginal penetration  hasn't attempted intercourse yet  Pain with intercourse:  N/A DrynessNo Climax: usually yes Marinoff Scale: 0/3  PREGNANCY: Vaginal deliveries 2 Tearing Yes: tearing with both that healed well  C-section deliveries 0 Currently pregnant No  PROLAPSE: Pressure with pooping   OBJECTIVE:  Note: Objective measures were completed at Evaluation unless otherwise noted.  PATIENT SURVEYS:   PFIQ-7: 48  COGNITION: Overall cognitive status: Within functional limits for tasks assessed     SENSATION: Light touch: Appears intact  LUMBAR  SPECIAL TESTS:  Single leg stance test: Positive  FUNCTIONAL TESTS:  Squat: lumbopelvic pain and feelings of weakness with transferring   GAIT: Comments: mild trendelenburg gait pattern with ambulation   POSTURE: rounded shoulders, forward head, and increased lumbar lordosis  LUMBARAROM/PROM:   A/PROM A/PROM  eval  Flexion 25% limited  Extension 25% limtied  Right lateral flexion 25% limited  Left lateral flexion 25% limited  Right rotation 25% limited  Left rotation 25% limited   (Blank rows = not tested)  LOWER EXTREMITY ROM: WNL  Active ROM Right eval Left eval  Hip flexion    Hip extension    Hip abduction    Hip adduction    Hip internal rotation    Hip external rotation    Knee flexion    Knee extension    Ankle dorsiflexion    Ankle plantarflexion    Ankle inversion    Ankle eversion     (Blank rows = not tested)  LOWER EXTREMITY MMT: 4-/5 bilateral knees grossly, 4/5 bilateral hips grossly   MMT Right eval Left eval  Hip flexion    Hip extension    Hip abduction    Hip adduction    Hip internal rotation    Hip external rotation    Knee flexion    Knee extension    Ankle dorsiflexion    Ankle plantarflexion    Ankle inversion    Ankle eversion     (Blank rows = not tested) PALPATION:   General: bilateral adductors and hip flexors were not TTP   Pelvic Alignment: WNL  Abdominal: upper chest breathing, decreased rib excursion with breathing                External Perineal Exam: minimal dryness noted. No significant perineal dropping with cough test.                             Internal Pelvic Floor: generally weak throughout pelvic floor with no pain during examination. Decreased pelvic floor AROM with inhalation/exhalation.   Patient confirms identification and approves PT to assess internal pelvic floor and treatment Yes No emotional/communication barriers or cognitive limitation. Patient is motivated to learn. Patient understands and  agrees with treatment goals and plan. PT explains  patient will be examined in standing, sitting, and lying down to see how their muscles and joints work. When they are ready, they will be asked to remove their underwear so PT can examine their perineum. The patient is also given the option of providing their own chaperone as one is not provided in our facility. The patient also has the right and is explained the right to defer or refuse any part of the evaluation or treatment including the internal exam. With the patient's consent, PT will use one gloved finger to gently assess the muscles of the pelvic floor, seeing how well it contracts and relaxes and if there is muscle symmetry. After, the patient will get dressed and PT and patient will discuss exam findings and plan of care. PT and patient discuss plan of care, schedule, attendance policy and HEP activities.  PELVIC MMT:   MMT eval  Vaginal 3/5, 3 sec hold, 10 quick flicks   Internal Anal Sphincter   External Anal Sphincter   Puborectalis   Diastasis Recti   (Blank rows = not tested)       TONE: WNL  PROLAPSE: N/A  TODAY'S TREATMENT:                                                                                                                              DATE:   EVAL 07/03/23: Examination completed, findings reviewed, pt educated on POC, HEP, and self care. Pt motivated to participate in PT and agreeable to attempt recommendations.  Self care: Relative anatomy, connection between pelvic floor and diaphragm, diaphragmatic breathing and abdominal excursion, vaginal moisturizer list and lubrication education (provided two water based lubrication samples) Neuro re-ed: Hooklying diaphragmatic breathing + pelvic floor lengthening with inhalation and shortening with exhalation 3x10  Hooklying pelvic floor muscle quick flicks + diaphragmatic breathing 2x10   07/25/23:  Self care: Relative anatomy, connection between pelvic floor and  diaphragm, diaphragmatic breathing and abdominal excursion, vaginal moisturizer list and lubrication education (provided two water based lubrication samples) Urge drill for managing urge incontinence and urgency on the way to the bathroom Neuro re-ed/therapeutic activity SEATED diaphragmatic breathing + pelvic floor lengthening with inhalation and shortening with exhalation 3x10  SEATED pelvic floor muscle quick flicks + diaphragmatic breathing 2x10  STS + GTB + diaphragmatic breathing 2x10  Seated clamshell + diaphragmatic breathing 2x20 (GTB)  Bridge + GTB around knees + diaphragmatic breathing 2x10  Hooklying opposite knee/hand ball press + diaphragmatic breathing 2x10 (hug the baby cue for contracting transverse abdominis)   PATIENT EDUCATION:  Education details: Relative anatomy, connection between pelvic floor and diaphragm, diaphragmatic breathing and abdominal excursion, vaginal moisturizer list and lubrication education (provided two water based lubrication samples) Person educated: Patient Education method: Explanation, Demonstration, Tactile cues, Verbal cues, and Handouts Education comprehension: verbalized understanding, returned demonstration, verbal cues required, and tactile cues requiredv  HOME EXERCISE PROGRAM: Access Code: [email or web address] URL: [email or web address] Date: 07/25/2023 Prepared by: Earna Coder  Exercises -  Seated Pelvic Floor Contraction  - 1 x daily - 7 x weekly - 2 sets - 10 reps - Seated Quick Flick Pelvic Floor Contractions  - 1 x daily - 7 x weekly - 2 sets - 10 reps - Squat with Chair Touch and Resistance Loop  - 1 x daily - 7 x weekly - 2 sets - 10 reps - Seated Hip Abduction  - 1 x daily - 7 x weekly - 2 sets - 10 reps - Supine Bridge with Resistance Band  - 1 x daily - 7 x weekly - 2 sets - 10 reps - Oblique Crunch  - 1 x daily - 7 x weekly - 2 sets - 10 reps  ASSESSMENT:  CLINICAL IMPRESSION: Patient is a 38 y.o. female who was seen  today for physical therapy treatment for postpartum-related weakness, pain, and stress urinary incontinence. Patient delivered her second child vaginally 03/25/23 and had tearing with stitches internally. Pain has decreased since initial eval and leakage has also decreased. She was taught the urge drill for urge incontinence and plans to practice this at home. Diastasis rectus abdominis assessment revealed no significant abdominal separation at/above/below umbilicus. Patient demonstrates a strong transverse abdominis contraction when cued to "hug the baby" while exhaling. Exercises were introduced to begin practicing pelvic floor control with movement, patient required minimal cueing with these. No pain following today's session. Pt would benefit from additional PT to further address deficits.     OBJECTIVE IMPAIRMENTS: decreased coordination, decreased endurance, decreased mobility, decreased ROM, decreased strength, and pain.   ACTIVITY LIMITATIONS: continence  PARTICIPATION LIMITATIONS:  exercise including pilates, vaginal penetration due to pain   PERSONAL FACTORS: Past/current experiences are also affecting patient's functional outcome.   REHAB POTENTIAL: Good  CLINICAL DECISION MAKING: Stable/uncomplicated  EVALUATION COMPLEXITY: Low   GOALS: Goals reviewed with patient? Yes  SHORT TERM GOALS: Target date: 07/31/2023  Pt will be independent with HEP.  Baseline: Goal status: INITIAL  2.  Pt will be independent with diaphragmatic breathing and down training activities in order to improve pelvic floor relaxation. Baseline:  Goal status: INITIAL  3.  Pt will be independent with the knack, urge suppression technique, and double voiding in order to improve bladder habits and decrease urinary incontinence.   Baseline:  Goal status: INITIAL  LONG TERM GOALS: Target date: 12/31/2023  Pt will be independent with advanced HEP.  Baseline:  Goal status: INITIAL  2.  Pt will report 75%  reduction of pain due to improvements in posture, strength, and muscle length to improve quality of life and functional mobility. Baseline:  Goal status: INITIAL  3.  Patient will report PFIQ-7 score of 24 points or less to suggest decreased functional limitations secondary to pelvic pain to improve quality of life. Baseline:  Goal status: INITIAL  4.  Pt will demonstrate normal pelvic floor muscle tone and A/ROM, able to achieve 4/5 strength with contractions and 10 sec endurance, in order to provide appropriate lumbopelvic support in functional activities.   Baseline:  Goal status: INITIAL  PLAN:  PT FREQUENCY: 1x/week  PT DURATION: 8 weeks  PLANNED INTERVENTIONS: 97110-Therapeutic exercises, 97530- Therapeutic activity, 97112- Neuromuscular re-education, 97535- Self Care, 16109- Manual therapy, Taping, Dry Needling, Joint mobilization, Spinal mobilization, Scar mobilization, Cryotherapy, and Moist heat  PLAN FOR NEXT SESSION: see how pain has been and perform internal for pain relief, progress pelvic floor AROM to seated position, continued lumbopelvic mobility, introduce LE strengthening   Omar Person, PT 07/25/2023, 4:58  PM

## 2023-07-25 NOTE — Patient Instructions (Signed)

## 2023-07-30 ENCOUNTER — Ambulatory Visit: Payer: Medicaid Other | Admitting: Physical Therapy

## 2023-07-30 DIAGNOSIS — R293 Abnormal posture: Secondary | ICD-10-CM

## 2023-07-30 DIAGNOSIS — M6281 Muscle weakness (generalized): Secondary | ICD-10-CM

## 2023-07-30 DIAGNOSIS — R279 Unspecified lack of coordination: Secondary | ICD-10-CM

## 2023-07-30 NOTE — Therapy (Signed)
 OUTPATIENT PHYSICAL THERAPY FEMALE PELVIC TREATMENT   Patient Name: [name] MRN: [id number] DOB:August 20, 1985, 38 y.o., female Today's Date: 07/30/2023  END OF SESSION:  PT End of Session - 07/30/23 1623     Visit Number 3    Number of Visits 8    Date for PT Re-Evaluation 07/31/23    Authorization Type Medicaid [employer]    Authorization Time Period [facility] Approved 4 visits-07/03/2023-09/01/2023 auth#[id number]    Authorization - Number of Visits 4    PT Start Time 0415    PT Stop Time 0500    PT Time Calculation (min) 45 min    Activity Tolerance Patient tolerated treatment well    Behavior During Therapy WFL for tasks assessed/performed               Past Medical History:  Diagnosis Date   Medical history non-contributory    Past Surgical History:  Procedure Laterality Date   DILATION AND CURETTAGE OF UTERUS     Patient Active Problem List   Diagnosis Date Noted   SVD (spontaneous vaginal delivery) 03/24/2023   Vaginal delivery 03/23/2023   Obstetric vaginal laceration 03/23/2023   Anemia complicating pregnancy 01/25/2023   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 12/28/2022   Alpha thalassemia silent carrier 12/28/2022   Fibroid uterus 12/28/2022   PCOS (polycystic ovarian syndrome) 11/30/2022   Old cervical tear 11/30/2022   Supervision of high risk pregnancy, antepartum 11/22/2022   AMA (advanced maternal age) multigravida 35+ 11/22/2022    PCP: Carlynn Herald, CNM  REFERRING PROVIDER: Richardson Landry, CNM   REFERRING DIAG: Z39.2 (ICD-10-CM) - Postpartum examination following vaginal delivery R32 (ICD-10-CM) - Urinary incontinence, unspecified type  THERAPY DIAG:  Muscle weakness (generalized)  Unspecified lack of coordination  Abnormal posture  Rationale for Evaluation and Treatment: Rehabilitation  ONSET DATE: 03/23/23  SUBJECTIVE:                                                                                                                                                                                            SUBJECTIVE STATEMENT: When she carries baby, groceries, and her bag up 3 flights of stairs, she notices increased fatigue and some spotting. This happened a couple of weeks ago, but she wants to make sure she isn't overdoing things. No leakage in the past week, but urgency goes from 0 to 10 when she needs to void. Urge drill is helpful. Last BM was Sunday.  PAIN:  Are you having pain? Yes NPRS scale: 7/10 at worst, 5/10 currently  Pain location: Internal, Deep, Bilateral, Anterior, and Posterior  Pain type: aching Pain description: intermittent and  dull   Aggravating factors: standing at work, pumping Relieving factors: resting, heat   PRECAUTIONS: None  RED FLAGS: None   WEIGHT BEARING RESTRICTIONS: No  FALLS:  Has patient fallen in last 6 months? 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   PLOF: Independent  PATIENT GOALS: decreased pain, get stronger  PERTINENT HISTORY:  2 vaginal deliveries  Sexual abuse: No  BOWEL MOVEMENT: Pain with bowel movement: Yes Type of bowel movement:Type (Bristol Stool Scale) 4, Frequency 1-2, Strain sometimes, and Splinting no Fully empty rectum: Yes:   Leakage: No Pads: No Fiber supplement/laxative Yes - stool softener   URINATION: Pain with urination: No Fully empty bladder: Yes:   Stream: Strong Urgency: Yes  Frequency: WNL Leakage: Urge to void and Sneezing Pads: No  INTERCOURSE:  Ability to have vaginal penetration  hasn't attempted intercourse yet  Pain with intercourse:  N/A DrynessNo Climax: usually yes Marinoff Scale: 0/3  PREGNANCY: Vaginal deliveries 2 Tearing Yes: tearing with both that healed well  C-section deliveries 0 Currently pregnant No  PROLAPSE: Pressure with pooping   OBJECTIVE:   Note: Objective measures were completed at Evaluation unless otherwise noted.  PATIENT SURVEYS:   PFIQ-7: 48  COGNITION: Overall cognitive status: Within functional limits for tasks assessed     SENSATION: Light touch: Appears intact  LUMBAR SPECIAL TESTS:  Single leg stance test: Positive  FUNCTIONAL TESTS:  Squat: lumbopelvic pain and feelings of weakness with transferring   GAIT: Comments: mild trendelenburg gait pattern with ambulation   POSTURE: rounded shoulders, forward head, and increased lumbar lordosis  LUMBARAROM/PROM:   A/PROM A/PROM  eval  Flexion 25% limited  Extension 25% limtied  Right lateral flexion 25% limited  Left lateral flexion 25% limited  Right rotation 25% limited  Left rotation 25% limited   (Blank rows = not tested)  LOWER EXTREMITY ROM: WNL  Active ROM Right eval Left eval  Hip flexion    Hip extension    Hip abduction    Hip adduction    Hip internal rotation    Hip external rotation    Knee flexion    Knee extension    Ankle dorsiflexion    Ankle plantarflexion    Ankle inversion    Ankle eversion     (Blank rows = not tested)  LOWER EXTREMITY MMT: 4-/5 bilateral knees grossly, 4/5 bilateral hips grossly   MMT Right eval Left eval  Hip flexion    Hip extension    Hip abduction    Hip adduction    Hip internal rotation    Hip external rotation    Knee flexion    Knee extension    Ankle dorsiflexion    Ankle plantarflexion    Ankle inversion    Ankle eversion     (Blank rows = not tested) PALPATION:   General: bilateral adductors and hip flexors were not TTP   Pelvic Alignment: WNL  Abdominal: upper chest breathing, decreased rib excursion with breathing                External Perineal Exam: minimal dryness noted. No significant perineal dropping with cough test.                             Internal Pelvic Floor: generally weak throughout pelvic  floor with no pain during examination. Decreased pelvic  floor AROM with inhalation/exhalation.   Patient confirms identification and approves PT to assess internal pelvic floor and treatment Yes No emotional/communication barriers or cognitive limitation. Patient is motivated to learn. Patient understands and agrees with treatment goals and plan. PT explains patient will be examined in standing, sitting, and lying down to see how their muscles and joints work. When they are ready, they will be asked to remove their underwear so PT can examine their perineum. The patient is also given the option of providing their own chaperone as one is not provided in our facility. The patient also has the right and is explained the right to defer or refuse any part of the evaluation or treatment including the internal exam. With the patient's consent, PT will use one gloved finger to gently assess the muscles of the pelvic floor, seeing how well it contracts and relaxes and if there is muscle symmetry. After, the patient will get dressed and PT and patient will discuss exam findings and plan of care. PT and patient discuss plan of care, schedule, attendance policy and HEP activities.  PELVIC MMT:   MMT eval  Vaginal 3/5, 3 sec hold, 10 quick flicks   Internal Anal Sphincter   External Anal Sphincter   Puborectalis   Diastasis Recti   (Blank rows = not tested)       TONE: WNL  PROLAPSE: N/A  TODAY'S TREATMENT:                                                                                                                              DATE:   EVAL 07/03/23: Examination completed, findings reviewed, pt educated on POC, HEP, and self care. Pt motivated to participate in PT and agreeable to attempt recommendations.  Self care: Relative anatomy, connection between pelvic floor and diaphragm, diaphragmatic breathing and abdominal excursion, vaginal moisturizer list and lubrication education (provided two water based lubrication samples) Neuro re-ed: Hooklying  diaphragmatic breathing + pelvic floor lengthening with inhalation and shortening with exhalation 3x10  Hooklying pelvic floor muscle quick flicks + diaphragmatic breathing 2x10   07/25/23:  Self care: Relative anatomy, connection between pelvic floor and diaphragm, diaphragmatic breathing and abdominal excursion, vaginal moisturizer list and lubrication education (provided two water based lubrication samples) Urge drill for managing urge incontinence and urgency on the way to the bathroom Neuro re-ed/therapeutic activity SEATED diaphragmatic breathing + pelvic floor lengthening with inhalation and shortening with exhalation 3x10  SEATED pelvic floor muscle quick flicks + diaphragmatic breathing 2x10  STS + GTB + diaphragmatic breathing 2x10  Seated clamshell + diaphragmatic breathing 2x20 (GTB)  Bridge + GTB around knees + diaphragmatic breathing 2x10  Hooklying opposite knee/hand ball press + diaphragmatic breathing 2x10 (hug the baby cue for contracting transverse abdominis)    07/30/23:  Self care: Relative anatomy, connection between pelvic floor and diaphragm, diaphragmatic breathing and abdominal excursion, vaginal moisturizer list and  lubrication education (provided two water based lubrication samples) Urge drill for managing urge incontinence and urgency on the way to the bathroom Neuro re-ed/manual therapy: Internal pelvic floor muscle release in superficial and deep pelvic floor to relieve muscle tension and assess active range of motion  diaphragmatic breathing + pelvic floor lengthening with inhalation and shortening with exhalation 3x10  Verbal review of last week's exercises: SEATED diaphragmatic breathing + pelvic floor lengthening with inhalation and shortening with exhalation 3x10  SEATED pelvic floor muscle quick flicks + diaphragmatic breathing 2x10  STS + GTB + diaphragmatic breathing 2x10  Seated clamshell + diaphragmatic breathing 2x20 (GTB)  Bridge + GTB around knees  + diaphragmatic breathing 2x10  Hooklying opposite knee/hand ball press + diaphragmatic breathing 2x10 (hug the baby cue for contracting transverse abdominis)   PATIENT EDUCATION:  Education details: Relative anatomy, connection between pelvic floor and diaphragm, diaphragmatic breathing and abdominal excursion, vaginal moisturizer list and lubrication education (provided two water based lubrication samples) Person educated: Patient Education method: Explanation, Demonstration, Tactile cues, Verbal cues, and Handouts Education comprehension: verbalized understanding, returned demonstration, verbal cues required, and tactile cues requiredv  HOME EXERCISE PROGRAM: Access Code: [email or web address] URL: [email or web address] Date: 07/25/2023 Prepared by: Earna Coder  Exercises - Seated Pelvic Floor Contraction  - 1 x daily - 7 x weekly - 2 sets - 10 reps - Seated Quick Flick Pelvic Floor Contractions  - 1 x daily - 7 x weekly - 2 sets - 10 reps - Squat with Chair Touch and Resistance Loop  - 1 x daily - 7 x weekly - 2 sets - 10 reps - Seated Hip Abduction  - 1 x daily - 7 x weekly - 2 sets - 10 reps - Supine Bridge with Resistance Band  - 1 x daily - 7 x weekly - 2 sets - 10 reps - Oblique Crunch  - 1 x daily - 7 x weekly - 2 sets - 10 reps  ASSESSMENT:  CLINICAL IMPRESSION: Patient is a 38 y.o. female who was seen today for physical therapy treatment for postpartum-related weakness, pain, and stress urinary incontinence. Patient delivered her second child vaginally 03/25/23 and had tearing with stitches internally. Patient reports 0/10 pain today and no urinary leakage, just some light brown/pink spotting that has been happening while carrying baby/bags/groceries up 3 flights of stairs. Patient advised to exhale during exertional tasks to regulate intraabdominal pressure and engage pelvic floor. Internal treatment revealed increased coordination between diaphragm and pelvic floor and  improved AROM with breathing techniques. Mild discomfort with palpation of delivery scar at introitus, patient advised that she can perform perineal massage on this area to decrease sensitivity. Pt would benefit from additional PT to further address deficits.     OBJECTIVE IMPAIRMENTS: decreased coordination, decreased endurance, decreased mobility, decreased ROM, decreased strength, and pain.   ACTIVITY LIMITATIONS: continence  PARTICIPATION LIMITATIONS:  exercise including pilates, vaginal penetration due to pain   PERSONAL FACTORS: Past/current experiences are also affecting patient's functional outcome.   REHAB POTENTIAL: Good  CLINICAL DECISION MAKING: Stable/uncomplicated  EVALUATION COMPLEXITY: Low   GOALS: Goals reviewed with patient? Yes  SHORT TERM GOALS: Target date: 07/31/2023  Pt will be independent with HEP.  Baseline: Goal status: INITIAL  2.  Pt will be independent with diaphragmatic breathing and down training activities in order to improve pelvic floor relaxation. Baseline:  Goal status: INITIAL  3.  Pt will be independent with the knack, urge suppression technique, and double voiding in order to improve  bladder habits and decrease urinary incontinence.   Baseline:  Goal status: INITIAL  LONG TERM GOALS: Target date: 12/31/2023  Pt will be independent with advanced HEP.  Baseline:  Goal status: INITIAL  2.  Pt will report 75% reduction of pain due to improvements in posture, strength, and muscle length to improve quality of life and functional mobility. Baseline:  Goal status: INITIAL  3.  Patient will report PFIQ-7 score of 24 points or less to suggest decreased functional limitations secondary to pelvic pain to improve quality of life. Baseline:  Goal status: INITIAL  4.  Pt will demonstrate normal pelvic floor muscle tone and A/ROM, able to achieve 4/5 strength with contractions and 10 sec endurance, in order to provide appropriate lumbopelvic  support in functional activities.   Baseline:  Goal status: INITIAL  PLAN:  PT FREQUENCY: 1x/week  PT DURATION: 8 weeks  PLANNED INTERVENTIONS: 97110-Therapeutic exercises, 97530- Therapeutic activity, 97112- Neuromuscular re-education, 97535- Self Care, [id number]- Manual therapy, Taping, Dry Needling, Joint mobilization, Spinal mobilization, Scar mobilization, Cryotherapy, and Moist heat  PLAN FOR NEXT SESSION: see how pain has been and perform internal for pain relief, progress pelvic floor AROM to seated position, continued lumbopelvic mobility, introduce LE strengthening   Omar Person, PT 07/30/2023, 4:24 PM

## 2023-08-06 ENCOUNTER — Ambulatory Visit: Payer: Medicaid Other | Attending: Certified Nurse Midwife | Admitting: Physical Therapy

## 2023-08-06 DIAGNOSIS — R293 Abnormal posture: Secondary | ICD-10-CM | POA: Diagnosis present

## 2023-08-06 DIAGNOSIS — R279 Unspecified lack of coordination: Secondary | ICD-10-CM | POA: Diagnosis present

## 2023-08-06 DIAGNOSIS — M6281 Muscle weakness (generalized): Secondary | ICD-10-CM | POA: Insufficient documentation

## 2023-08-06 NOTE — Therapy (Signed)
 OUTPATIENT PHYSICAL THERAPY FEMALE PELVIC TREATMENT   Patient Name: EMERITA BERKEMEIER MRN: [id number] DOB:May 31, 1985, 38 y.o., female Today's Date: 08/06/2023  END OF SESSION:  PT End of Session - 08/06/23 1658     Visit Number 4    Number of Visits 8    Date for PT Re-Evaluation 07/31/23    Authorization Type Medicaid [employer]    Authorization Time Period [facility]MD Approved 4 visits-07/03/2023-09/01/2023 auth#[id number]    Authorization - Number of Visits 4    PT Start Time 0414    PT Stop Time 0455    PT Time Calculation (min) 41 min    Activity Tolerance Patient tolerated treatment well    Behavior During Therapy [facility] for tasks assessed/performed                Past Medical History:  Diagnosis Date   Medical history non-contributory    Past Surgical History:  Procedure Laterality Date   DILATION AND CURETTAGE OF UTERUS     Patient Active Problem List   Diagnosis Date Noted   SVD (spontaneous vaginal delivery) 03/24/2023   Vaginal delivery 03/23/2023   Obstetric vaginal laceration 03/23/2023   Anemia complicating pregnancy 01/25/2023   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 12/28/2022   Alpha thalassemia silent carrier 12/28/2022   Fibroid uterus 12/28/2022   PCOS (polycystic ovarian syndrome) 11/30/2022   Old cervical tear 11/30/2022   Supervision of high risk pregnancy, antepartum 11/22/2022   AMA (advanced maternal age) multigravida 35+ 11/22/2022    PCP: Carlynn Herald, CNM  REFERRING PROVIDER: Richardson Landry, CNM   REFERRING DIAG: Z39.2 (ICD-10-CM) - Postpartum examination following vaginal delivery R32 (ICD-10-CM) - Urinary incontinence, unspecified type  THERAPY DIAG:  Muscle weakness (generalized)  Unspecified lack of coordination  Abnormal posture  Rationale for Evaluation and Treatment: Rehabilitation  ONSET DATE: 03/23/23  SUBJECTIVE:                                                                                                                                                                                            SUBJECTIVE STATEMENT: Patient reports that she is doing well today. She is struggling to find the time in the day to exercise. 3/10 pain today, vaginal heaviness is 4/10 - a little bit better. Bowel movements have been slowly getting more regular.   PAIN:  Are you having pain? Yes NPRS scale: 7/10 at worst, 5/10 currently  Pain location: Internal, Deep, Bilateral, Anterior, and Posterior  Pain type: aching Pain description: intermittent and dull   Aggravating factors: standing at work, pumping Relieving factors: resting, heat   PRECAUTIONS: None  RED FLAGS: None  WEIGHT BEARING RESTRICTIONS: No  FALLS:  Has patient fallen in last 6 months? 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   PLOF: Independent  PATIENT GOALS: decreased pain, get stronger  PERTINENT HISTORY:  2 vaginal deliveries  Sexual abuse: No  BOWEL MOVEMENT: Pain with bowel movement: Yes Type of bowel movement:Type (Bristol Stool Scale) 4, Frequency 1-2, Strain sometimes, and Splinting no Fully empty rectum: Yes:   Leakage: No Pads: No Fiber supplement/laxative Yes - stool softener   URINATION: Pain with urination: No Fully empty bladder: Yes:   Stream: Strong Urgency: Yes  Frequency: WNL Leakage: Urge to void and Sneezing Pads: No  INTERCOURSE:  Ability to have vaginal penetration  hasn't attempted intercourse yet  Pain with intercourse:  N/A DrynessNo Climax: usually yes Marinoff Scale: 0/3  PREGNANCY: Vaginal deliveries 2 Tearing Yes: tearing with both that healed well  C-section deliveries 0 Currently pregnant No  PROLAPSE: Pressure with pooping   OBJECTIVE:  Note: Objective measures were completed at Evaluation unless otherwise noted.  PATIENT SURVEYS:   PFIQ-7:  48  COGNITION: Overall cognitive status: Within functional limits for tasks assessed     SENSATION: Light touch: Appears intact  LUMBAR SPECIAL TESTS:  Single leg stance test: Positive  FUNCTIONAL TESTS:  Squat: lumbopelvic pain and feelings of weakness with transferring   GAIT: Comments: mild trendelenburg gait pattern with ambulation   POSTURE: rounded shoulders, forward head, and increased lumbar lordosis  LUMBARAROM/PROM:   A/PROM A/PROM  eval  Flexion 25% limited  Extension 25% limtied  Right lateral flexion 25% limited  Left lateral flexion 25% limited  Right rotation 25% limited  Left rotation 25% limited   (Blank rows = not tested)  LOWER EXTREMITY ROM: WNL  Active ROM Right eval Left eval  Hip flexion    Hip extension    Hip abduction    Hip adduction    Hip internal rotation    Hip external rotation    Knee flexion    Knee extension    Ankle dorsiflexion    Ankle plantarflexion    Ankle inversion    Ankle eversion     (Blank rows = not tested)  LOWER EXTREMITY MMT: 4-/5 bilateral knees grossly, 4/5 bilateral hips grossly   MMT Right eval Left eval  Hip flexion    Hip extension    Hip abduction    Hip adduction    Hip internal rotation    Hip external rotation    Knee flexion    Knee extension    Ankle dorsiflexion    Ankle plantarflexion    Ankle inversion    Ankle eversion     (Blank rows = not tested) PALPATION:   General: bilateral adductors and hip flexors were not TTP   Pelvic Alignment: WNL  Abdominal: upper chest breathing, decreased rib excursion with breathing                External Perineal Exam: minimal dryness noted. No significant perineal dropping with cough test.                             Internal Pelvic Floor: generally weak throughout pelvic floor with no pain during examination. Decreased pelvic floor AROM with inhalation/exhalation.   Patient confirms identification and approves PT to assess internal  pelvic  floor and treatment Yes No emotional/communication barriers or cognitive limitation. Patient is motivated to learn. Patient understands and agrees with treatment goals and plan. PT explains patient will be examined in standing, sitting, and lying down to see how their muscles and joints work. When they are ready, they will be asked to remove their underwear so PT can examine their perineum. The patient is also given the option of providing their own chaperone as one is not provided in our facility. The patient also has the right and is explained the right to defer or refuse any part of the evaluation or treatment including the internal exam. With the patient's consent, PT will use one gloved finger to gently assess the muscles of the pelvic floor, seeing how well it contracts and relaxes and if there is muscle symmetry. After, the patient will get dressed and PT and patient will discuss exam findings and plan of care. PT and patient discuss plan of care, schedule, attendance policy and HEP activities.  PELVIC MMT:   MMT eval  Vaginal 3/5, 3 sec hold, 10 quick flicks   Internal Anal Sphincter   External Anal Sphincter   Puborectalis   Diastasis Recti   (Blank rows = not tested)       TONE: WNL  PROLAPSE: N/A  TODAY'S TREATMENT:                                                                                                                              DATE:    07/25/23:  Self care: Relative anatomy, connection between pelvic floor and diaphragm, diaphragmatic breathing and abdominal excursion, vaginal moisturizer list and lubrication education (provided two water based lubrication samples) Urge drill for managing urge incontinence and urgency on the way to the bathroom Neuro re-ed/therapeutic activity SEATED diaphragmatic breathing + pelvic floor lengthening with inhalation and shortening with exhalation 3x10  SEATED pelvic floor muscle quick flicks + diaphragmatic breathing 2x10  STS + GTB  + diaphragmatic breathing 2x10  Seated clamshell + diaphragmatic breathing 2x20 (GTB)  Bridge + GTB around knees + diaphragmatic breathing 2x10  Hooklying opposite knee/hand ball press + diaphragmatic breathing 2x10 (hug the baby cue for contracting transverse abdominis)    07/30/23:  Self care: Relative anatomy, connection between pelvic floor and diaphragm, diaphragmatic breathing and abdominal excursion, vaginal moisturizer list and lubrication education (provided two water based lubrication samples) Urge drill for managing urge incontinence and urgency on the way to the bathroom Neuro re-ed/manual therapy: Internal pelvic floor muscle release in superficial and deep pelvic floor to relieve muscle tension and assess active range of motion  diaphragmatic breathing + pelvic floor lengthening with inhalation and shortening with exhalation 3x10  Verbal review of last week's exercises: SEATED diaphragmatic breathing + pelvic floor lengthening with inhalation and shortening with exhalation 3x10  SEATED pelvic floor muscle quick flicks + diaphragmatic breathing 2x10  STS + GTB + diaphragmatic breathing 2x10  Seated clamshell +  diaphragmatic breathing 2x20 (GTB)  Bridge + GTB around knees + diaphragmatic breathing 2x10  Hooklying opposite knee/hand ball press + diaphragmatic breathing 2x10 (hug the baby cue for contracting transverse abdominis)  08/06/23:  Manual therapy: Trigger Point Dry Needling Initial Treatment: Pt instructed on Dry Needling rational, procedures, and possible side effects. Pt instructed to expect mild to moderate muscle soreness later in the day and/or into the next day.  Pt instructed in methods to reduce muscle soreness. Pt instructed to continue prescribed HEP. Because Dry Needling was performed over or adjacent to a lung field, pt was educated on S/S of pneumothorax and to seek immediate medical attention should they occur.  Patient was educated on signs and symptoms of  infection and other risk factors and advised to seek medical attention should they occur.  Patient verbalized understanding of these instructions and education.  Patient Verbal Consent Given: Yes Education Handout Provided: Yes Muscles Treated: lumbar multifidi L3/L4 bilaterally  Electrical Stimulation Performed: No Treatment Response/Outcome: decreased palpable trigger points and muscle tension, decreased pain with standing lumbar extension  Self care: Relative anatomy, connection between pelvic floor and diaphragm, diaphragmatic breathing and abdominal excursion, vaginal moisturizer list and lubrication education (provided two water based lubrication samples) Urge drill for managing urge incontinence and urgency on the way to the bathroom Neuro re-ed/manual therapy: SEATED diaphragmatic breathing + pelvic floor lengthening with inhalation and shortening with exhalation 3x10  SEATED pelvic floor muscle quick flicks + diaphragmatic breathing 2x10  STS + BlueTB + diaphragmatic breathing 2x10  sidelying clamshell + diaphragmatic breathing 2x20 (BlueTB)  Bridge + BlueTB around knees + diaphragmatic breathing 2x10  Hooklying opposite knee/hand ball press + diaphragmatic breathing 2x10 (hug the baby cue for contracting transverse abdominis)  Bird dog + diaphragmatic breathing 2x10 alternating  Sidelying hip abduction + diaphragmatic breathing 2x10   PATIENT EDUCATION:  Education details: Relative anatomy, connection between pelvic floor and diaphragm, diaphragmatic breathing and abdominal excursion, vaginal moisturizer list and lubrication education (provided two water based lubrication samples) Person educated: Patient Education method: Explanation, Demonstration, Tactile cues, Verbal cues, and Handouts Education comprehension: verbalized understanding, returned demonstration, verbal cues required, and tactile cues requiredv  HOME EXERCISE PROGRAM: Access Code: [email or web address] URL:  [email or web address] Date: 08/06/2023 Prepared by: Earna Coder  Exercises - Seated Pelvic Floor Contraction  - 1 x daily - 7 x weekly - 2 sets - 10 reps - Seated Quick Flick Pelvic Floor Contractions  - 1 x daily - 7 x weekly - 2 sets - 10 reps - Squat with Chair Touch and Resistance Loop  - 1 x daily - 7 x weekly - 2 sets - 10 reps - Supine Bridge with Resistance Band  - 1 x daily - 7 x weekly - 2 sets - 10 reps - Oblique Crunch  - 1 x daily - 7 x weekly - 2 sets - 10 reps - Clamshell with Resistance  - 1 x daily - 7 x weekly - 2 sets - 10 reps - Sidelying Hip Abduction  - 1 x daily - 7 x weekly - 2 sets - 10 reps - Bird Dog  - 1 x daily - 7 x weekly - 2 sets - 10 reps - Side Stepping with Resistance at Thighs  - 1 x daily - 7 x weekly - 2 sets - 10 reps  ASSESSMENT:  CLINICAL IMPRESSION: Patient is a 38 y.o. female who was seen today for physical therapy treatment for postpartum-related weakness, pain, and stress urinary incontinence. Patient  delivered her second child vaginally 03/25/23 and had tearing with stitches internally. Patient reports 3/10 pain today and no urinary leakage. Exercises progressed with emphasis on diaphragmatic breathing to involve pelvic floor and hip strengthening to support the lumbopelvic region. No increase in pain following today's session. Pt would benefit from additional PT to further address deficits.     OBJECTIVE IMPAIRMENTS: decreased coordination, decreased endurance, decreased mobility, decreased ROM, decreased strength, and pain.   ACTIVITY LIMITATIONS: continence  PARTICIPATION LIMITATIONS:  exercise including pilates, vaginal penetration due to pain   PERSONAL FACTORS: Past/current experiences are also affecting patient's functional outcome.   REHAB POTENTIAL: Good  CLINICAL DECISION MAKING: Stable/uncomplicated  EVALUATION COMPLEXITY: Low   GOALS: Goals reviewed with patient? Yes  SHORT TERM GOALS: Target date:  07/31/2023  Pt will be independent with HEP.  Baseline: Goal status: INITIAL  2.  Pt will be independent with diaphragmatic breathing and down training activities in order to improve pelvic floor relaxation. Baseline:  Goal status: INITIAL  3.  Pt will be independent with the knack, urge suppression technique, and double voiding in order to improve bladder habits and decrease urinary incontinence.   Baseline:  Goal status: INITIAL  LONG TERM GOALS: Target date: 12/31/2023  Pt will be independent with advanced HEP.  Baseline:  Goal status: INITIAL  2.  Pt will report 75% reduction of pain due to improvements in posture, strength, and muscle length to improve quality of life and functional mobility. Baseline:  Goal status: INITIAL  3.  Patient will report PFIQ-7 score of 24 points or less to suggest decreased functional limitations secondary to pelvic pain to improve quality of life. Baseline:  Goal status: INITIAL  4.  Pt will demonstrate normal pelvic floor muscle tone and A/ROM, able to achieve 4/5 strength with contractions and 10 sec endurance, in order to provide appropriate lumbopelvic support in functional activities.   Baseline:  Goal status: INITIAL  PLAN:  PT FREQUENCY: 1x/week  PT DURATION: 8 weeks  PLANNED INTERVENTIONS: 97110-Therapeutic exercises, 97530- Therapeutic activity, 97112- Neuromuscular re-education, 97535- Self Care, 13086- Manual therapy, Taping, Dry Needling, Joint mobilization, Spinal mobilization, Scar mobilization, Cryotherapy, and Moist heat  PLAN FOR NEXT SESSION: see how pain has been and perform internal for pain relief, progress pelvic floor AROM to seated position, continued lumbopelvic mobility, introduce LE strengthening   Omar Person, PT 08/06/2023, 4:59 PM

## 2023-08-13 ENCOUNTER — Ambulatory Visit: Payer: Medicaid Other | Admitting: Physical Therapy

## 2023-08-13 NOTE — Therapy (Signed)
 OUTPATIENT PHYSICAL THERAPY FEMALE PELVIC TREATMENT   Patient Name: [name] MRN: [id number] DOB:08/19/1985, 38 y.o., female Today's Date: 08/13/2023  END OF SESSION:      Past Medical History:  Diagnosis Date   Medical history non-contributory    Past Surgical History:  Procedure Laterality Date   DILATION AND CURETTAGE OF UTERUS     Patient Active Problem List   Diagnosis Date Noted   SVD (spontaneous vaginal delivery) 03/24/2023   Vaginal delivery 03/23/2023   Obstetric vaginal laceration 03/23/2023   Anemia complicating pregnancy 01/25/2023   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 12/28/2022   Alpha thalassemia silent carrier 12/28/2022   Fibroid uterus 12/28/2022   PCOS (polycystic ovarian syndrome) 11/30/2022   Old cervical tear 11/30/2022   Supervision of high risk pregnancy, antepartum 11/22/2022   AMA (advanced maternal age) multigravida 35+ 11/22/2022    PCP: Carlynn Herald, CNM  REFERRING PROVIDER: Richardson Landry, CNM   REFERRING DIAG: Z39.2 (ICD-10-CM) - Postpartum examination following vaginal delivery R32 (ICD-10-CM) - Urinary incontinence, unspecified type  THERAPY DIAG:  Muscle weakness (generalized)  Unspecified lack of coordination  Abnormal posture  Rationale for Evaluation and Treatment: Rehabilitation  ONSET DATE: 03/23/23  SUBJECTIVE:                                                                                                                                                                                           SUBJECTIVE STATEMENT: When she carries baby, groceries, and her bag up 3 flights of stairs, she notices increased fatigue and some spotting. This happened a couple of weeks ago, but she wants to make sure she isn't overdoing things. No leakage in the past week, but urgency goes from 0 to 10 when she needs to void. Urge drill is helpful. Last BM was Sunday.  PAIN:  Are you having pain? Yes NPRS scale: 7/10 at worst, 5/10  currently  Pain location: Internal, Deep, Bilateral, Anterior, and Posterior  Pain type: aching Pain description: intermittent and dull   Aggravating factors: standing at work, pumping Relieving factors: resting, heat   PRECAUTIONS: None  RED FLAGS: None   WEIGHT BEARING RESTRICTIONS: No  FALLS:  Has patient fallen in last 6 months? 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   PLOF: Independent  PATIENT GOALS: decreased pain, get stronger  PERTINENT HISTORY:  2 vaginal deliveries  Sexual abuse: No  BOWEL MOVEMENT: Pain with bowel movement: Yes Type of bowel movement:Type (Bristol Stool Scale) 4,  Frequency 1-2, Strain sometimes, and Splinting no Fully empty rectum: Yes:   Leakage: No Pads: No Fiber supplement/laxative Yes - stool softener   URINATION: Pain with urination: No Fully empty bladder: Yes:   Stream: Strong Urgency: Yes  Frequency: WNL Leakage: Urge to void and Sneezing Pads: No  INTERCOURSE:  Ability to have vaginal penetration  hasn't attempted intercourse yet  Pain with intercourse:  N/A DrynessNo Climax: usually yes Marinoff Scale: 0/3  PREGNANCY: Vaginal deliveries 2 Tearing Yes: tearing with both that healed well  C-section deliveries 0 Currently pregnant No  PROLAPSE: Pressure with pooping   OBJECTIVE:  Note: Objective measures were completed at Evaluation unless otherwise noted.  PATIENT SURVEYS:   Eval: PFIQ-7 = 48 Reassessment 08/06/23: PFIQ-7: 30  COGNITION: Overall cognitive status: Within functional limits for tasks assessed     SENSATION: Light touch: Appears intact  LUMBAR SPECIAL TESTS:  Single leg stance test: Positive  FUNCTIONAL TESTS:  Squat: lumbopelvic pain and feelings of weakness with transferring   GAIT: Comments: mild trendelenburg gait pattern with ambulation    POSTURE: rounded shoulders, forward head, and increased lumbar lordosis  LUMBARAROM/PROM:   A/PROM A/PROM  eval  Flexion 25% limited  Extension 25% limtied  Right lateral flexion 25% limited  Left lateral flexion 25% limited  Right rotation 25% limited  Left rotation 25% limited   (Blank rows = not tested)  LOWER EXTREMITY ROM: WNL  Active ROM Right eval Left eval  Hip flexion    Hip extension    Hip abduction    Hip adduction    Hip internal rotation    Hip external rotation    Knee flexion    Knee extension    Ankle dorsiflexion    Ankle plantarflexion    Ankle inversion    Ankle eversion     (Blank rows = not tested)  LOWER EXTREMITY MMT: 4-/5 bilateral knees grossly, 4/5 bilateral hips grossly   MMT Right eval Left eval  Hip flexion    Hip extension    Hip abduction    Hip adduction    Hip internal rotation    Hip external rotation    Knee flexion    Knee extension    Ankle dorsiflexion    Ankle plantarflexion    Ankle inversion    Ankle eversion     (Blank rows = not tested) PALPATION:   General: bilateral adductors and hip flexors were not TTP   Pelvic Alignment: WNL  Abdominal: upper chest breathing, decreased rib excursion with breathing                External Perineal Exam: minimal dryness noted. No significant perineal dropping with cough test.                             Internal Pelvic Floor: generally weak throughout pelvic floor with no pain during examination. Decreased pelvic floor AROM with inhalation/exhalation.   Patient confirms identification and approves PT to assess internal pelvic floor and treatment Yes No emotional/communication barriers or cognitive limitation. Patient is motivated to learn. Patient understands and agrees with treatment goals and plan. PT explains patient will be examined in standing, sitting, and lying down to see how their muscles and joints work. When they are ready, they will be asked to remove their  underwear so PT can examine their perineum. The patient is also given the option of providing their own chaperone as one is  not provided in our facility. The patient also has the right and is explained the right to defer or refuse any part of the evaluation or treatment including the internal exam. With the patient's consent, PT will use one gloved finger to gently assess the muscles of the pelvic floor, seeing how well it contracts and relaxes and if there is muscle symmetry. After, the patient will get dressed and PT and patient will discuss exam findings and plan of care. PT and patient discuss plan of care, schedule, attendance policy and HEP activities.  PELVIC MMT:   MMT eval Reassessment 08/06/23  Vaginal 3/5, 3 sec hold, 10 quick flicks  3+/5, 10 sec hold, 10 quick flicks  Internal Anal Sphincter    External Anal Sphincter    Puborectalis    Diastasis Recti    (Blank rows = not tested)       TONE: WNL  PROLAPSE: N/A  TODAY'S TREATMENT:                                                                                                                              DATE:   EVAL 07/03/23: Examination completed, findings reviewed, pt educated on POC, HEP, and self care. Pt motivated to participate in PT and agreeable to attempt recommendations.  Self care: Relative anatomy, connection between pelvic floor and diaphragm, diaphragmatic breathing and abdominal excursion, vaginal moisturizer list and lubrication education (provided two water based lubrication samples) Neuro re-ed: Hooklying diaphragmatic breathing + pelvic floor lengthening with inhalation and shortening with exhalation 3x10  Hooklying pelvic floor muscle quick flicks + diaphragmatic breathing 2x10   07/25/23:  Self care: Relative anatomy, connection between pelvic floor and diaphragm, diaphragmatic breathing and abdominal excursion, vaginal moisturizer list and lubrication education (provided two water based lubrication  samples) Urge drill for managing urge incontinence and urgency on the way to the bathroom Neuro re-ed/therapeutic activity SEATED diaphragmatic breathing + pelvic floor lengthening with inhalation and shortening with exhalation 3x10  SEATED pelvic floor muscle quick flicks + diaphragmatic breathing 2x10  STS + GTB + diaphragmatic breathing 2x10  Seated clamshell + diaphragmatic breathing 2x20 (GTB)  Bridge + GTB around knees + diaphragmatic breathing 2x10  Hooklying opposite knee/hand ball press + diaphragmatic breathing 2x10 (hug the baby cue for contracting transverse abdominis)    07/30/23:  Self care: Relative anatomy, connection between pelvic floor and diaphragm, diaphragmatic breathing and abdominal excursion, vaginal moisturizer list and lubrication education (provided two water based lubrication samples) Urge drill for managing urge incontinence and urgency on the way to the bathroom Neuro re-ed/manual therapy: Internal pelvic floor muscle release in superficial and deep pelvic floor to relieve muscle tension and assess active range of motion  diaphragmatic breathing + pelvic floor lengthening with inhalation and shortening with exhalation 3x10  Verbal review of last week's exercises: SEATED diaphragmatic breathing + pelvic floor lengthening with inhalation and shortening with exhalation 3x10  SEATED pelvic floor muscle quick flicks +  diaphragmatic breathing 2x10  STS + GTB + diaphragmatic breathing 2x10  Seated clamshell + diaphragmatic breathing 2x20 (GTB)  Bridge + GTB around knees + diaphragmatic breathing 2x10  Hooklying opposite knee/hand ball press + diaphragmatic breathing 2x10 (hug the baby cue for contracting transverse abdominis)   PATIENT EDUCATION:  Education details: Relative anatomy, connection between pelvic floor and diaphragm, diaphragmatic breathing and abdominal excursion, vaginal moisturizer list and lubrication education (provided two water based lubrication  samples) Person educated: Patient Education method: Explanation, Demonstration, Tactile cues, Verbal cues, and Handouts Education comprehension: verbalized understanding, returned demonstration, verbal cues required, and tactile cues requiredv  HOME EXERCISE PROGRAM: Access Code: [email or web address] URL: [email or web address] Date: 07/25/2023 Prepared by: Earna Coder  Exercises - Seated Pelvic Floor Contraction  - 1 x daily - 7 x weekly - 2 sets - 10 reps - Seated Quick Flick Pelvic Floor Contractions  - 1 x daily - 7 x weekly - 2 sets - 10 reps - Squat with Chair Touch and Resistance Loop  - 1 x daily - 7 x weekly - 2 sets - 10 reps - Seated Hip Abduction  - 1 x daily - 7 x weekly - 2 sets - 10 reps - Supine Bridge with Resistance Band  - 1 x daily - 7 x weekly - 2 sets - 10 reps - Oblique Crunch  - 1 x daily - 7 x weekly - 2 sets - 10 reps  ASSESSMENT:  CLINICAL IMPRESSION: Patient is a 38 y.o. female who was seen today for physical therapy treatment for postpartum-related weakness, pain, and stress urinary incontinence. Patient delivered her second child vaginally 03/25/23 and had tearing with stitches internally. Patient reports 3/10 pain today and no urinary leakage. Exercises progressed with emphasis on diaphragmatic breathing to involve pelvic floor and hip strengthening to support the lumbopelvic region. No increase in pain following today's session. Pt would benefit from additional PT to further address deficits.       OBJECTIVE IMPAIRMENTS: decreased coordination, decreased endurance, decreased mobility, decreased ROM, decreased strength, and pain.   ACTIVITY LIMITATIONS: continence  PARTICIPATION LIMITATIONS:  exercise including pilates, vaginal penetration due to pain   PERSONAL FACTORS: Past/current experiences are also affecting patient's functional outcome.   REHAB POTENTIAL: Good  CLINICAL DECISION MAKING: Stable/uncomplicated  EVALUATION COMPLEXITY:  Low   GOALS: Goals reviewed with patient? Yes  SHORT TERM GOALS: Target date: 07/31/2023  Pt will be independent with HEP.  Baseline: Goal status: GOAL MET 08/06/23  2.  Pt will be independent with diaphragmatic breathing and down training activities in order to improve pelvic floor relaxation. Baseline:  Goal status: GOAL MET 08/06/23  3.  Pt will be independent with the knack, urge suppression technique, and double voiding in order to improve bladder habits and decrease urinary incontinence.   Baseline:  Goal status: GOAL MET 08/06/23  LONG TERM GOALS: Target date: 12/31/2023  Pt will be independent with advanced HEP.  Baseline:  Goal status: ONGOING 08/06/23  2.  Pt will report 75% reduction of pain due to improvements in posture, strength, and muscle length to improve quality of life and functional mobility. Baseline:  Goal status: ONGOING 08/06/23  3.  Patient will report PFIQ-7 score of 24 points or less to suggest decreased functional limitations secondary to pelvic pain to improve quality of life. Baseline:  Goal status: ONGOING 08/06/23  4.  Pt will demonstrate normal pelvic floor muscle tone and A/ROM, able to achieve 4/5 strength with contractions and 10 sec endurance, in  order to provide appropriate lumbopelvic support in functional activities.   Baseline:  Goal status: ONGOING 08/06/23  PLAN:  PT FREQUENCY: 1x/week  PT DURATION: 8 weeks  PLANNED INTERVENTIONS: 97110-Therapeutic exercises, 97530- Therapeutic activity, 97112- Neuromuscular re-education, 97535- Self Care, [id number]- Manual therapy, Taping, Dry Needling, Joint mobilization, Spinal mobilization, Scar mobilization, Cryotherapy, and Moist heat  PLAN FOR NEXT SESSION: see how pain has been and perform internal for pain relief, progress pelvic floor AROM to seated position, continued lumbopelvic mobility, introduce LE strengthening   Omar Person, PT 08/13/2023, 12:54 PM

## 2023-08-20 ENCOUNTER — Ambulatory Visit: Payer: Medicaid Other | Admitting: Physical Therapy

## 2023-08-20 DIAGNOSIS — M6281 Muscle weakness (generalized): Secondary | ICD-10-CM | POA: Diagnosis not present

## 2023-08-20 DIAGNOSIS — R279 Unspecified lack of coordination: Secondary | ICD-10-CM

## 2023-08-20 DIAGNOSIS — R293 Abnormal posture: Secondary | ICD-10-CM

## 2023-08-20 NOTE — Therapy (Signed)
 OUTPATIENT PHYSICAL THERAPY FEMALE PELVIC TREATMENT   Patient Name: [name] MRN: [id number] DOB:05/31/1985, 38 y.o., female Today's Date: 08/20/2023  END OF SESSION:  PT End of Session - 08/20/23 1321     Visit Number 5    Number of Visits 8    Date for PT Re-Evaluation 07/31/23    Authorization Type Medicaid [employer]    Authorization Time Period [facility] Approved 4 visits-07/03/2023-09/01/2023 auth#[id number]    Authorization - Number of Visits 4    PT Start Time 1235    PT Stop Time 1315    PT Time Calculation (min) 40 min    Activity Tolerance Patient tolerated treatment well    Behavior During Therapy WFL for tasks assessed/performed                Past Medical History:  Diagnosis Date   Medical history non-contributory    Past Surgical History:  Procedure Laterality Date   DILATION AND CURETTAGE OF UTERUS     Patient Active Problem List   Diagnosis Date Noted   SVD (spontaneous vaginal delivery) 03/24/2023   Vaginal delivery 03/23/2023   Obstetric vaginal laceration 03/23/2023   Anemia complicating pregnancy 01/25/2023   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 12/28/2022   Alpha thalassemia silent carrier 12/28/2022   Fibroid uterus 12/28/2022   PCOS (polycystic ovarian syndrome) 11/30/2022   Old cervical tear 11/30/2022   Supervision of high risk pregnancy, antepartum 11/22/2022   AMA (advanced maternal age) multigravida 35+ 11/22/2022    PCP: Tari Fare, CNM  REFERRING PROVIDER: Salomon Cree, CNM   REFERRING DIAG: Z39.2 (ICD-10-CM) - Postpartum examination following vaginal delivery R32 (ICD-10-CM) - Urinary incontinence, unspecified type  THERAPY DIAG:  Muscle weakness (generalized)  Unspecified lack of coordination  Abnormal posture  Rationale for Evaluation and Treatment: Rehabilitation  ONSET DATE: 03/23/23  SUBJECTIVE:                                                                                                                                                                                            SUBJECTIVE STATEMENT: Patient reports that she is doing well. She reports that her pelvis is feeling good today. She has been having some mommy carpal tunnel when picking up her baby (13 lbs now). She is planning to start cardio this week. No bleeding/spotting this week. No major urinary concerns to report.  PAIN:  Are you having pain? Yes NPRS scale: 7/10 at worst, 5/10 currently  Pain location: Internal, Deep, Bilateral, Anterior, and Posterior  Pain type: aching Pain description: intermittent and dull   Aggravating factors: standing at work, pumping Relieving factors: resting,  heat   PRECAUTIONS: None  RED FLAGS: None   WEIGHT BEARING RESTRICTIONS: No  FALLS:  Has patient fallen in last 6 months? 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   PLOF: Independent  PATIENT GOALS: decreased pain, get stronger  PERTINENT HISTORY:  2 vaginal deliveries  Sexual abuse: No  BOWEL MOVEMENT: Pain with bowel movement: Yes Type of bowel movement:Type (Bristol Stool Scale) 4, Frequency 1-2, Strain sometimes, and Splinting no Fully empty rectum: Yes:   Leakage: No Pads: No Fiber supplement/laxative Yes - stool softener   URINATION: Pain with urination: No Fully empty bladder: Yes:   Stream: Strong Urgency: Yes  Frequency: WNL Leakage: Urge to void and Sneezing Pads: No  INTERCOURSE:  Ability to have vaginal penetration  hasn't attempted intercourse yet  Pain with intercourse:  N/A DrynessNo Climax: usually yes Marinoff Scale: 0/3  PREGNANCY: Vaginal deliveries 2 Tearing Yes: tearing with both that healed well  C-section deliveries 0 Currently pregnant No  PROLAPSE: Pressure with pooping   OBJECTIVE:  Note: Objective measures were completed at  Evaluation unless otherwise noted.  PATIENT SURVEYS:   Eval: PFIQ-7 = 48 Reassessment 08/06/23: PFIQ-7: 30  COGNITION: Overall cognitive status: Within functional limits for tasks assessed     SENSATION: Light touch: Appears intact  LUMBAR SPECIAL TESTS:  Single leg stance test: Positive  FUNCTIONAL TESTS:  Squat: lumbopelvic pain and feelings of weakness with transferring   GAIT: Comments: mild trendelenburg gait pattern with ambulation   POSTURE: rounded shoulders, forward head, and increased lumbar lordosis  LUMBARAROM/PROM:   A/PROM A/PROM  eval  Flexion 25% limited  Extension 25% limtied  Right lateral flexion 25% limited  Left lateral flexion 25% limited  Right rotation 25% limited  Left rotation 25% limited   (Blank rows = not tested)  LOWER EXTREMITY ROM: WNL  Active ROM Right eval Left eval  Hip flexion    Hip extension    Hip abduction    Hip adduction    Hip internal rotation    Hip external rotation    Knee flexion    Knee extension    Ankle dorsiflexion    Ankle plantarflexion    Ankle inversion    Ankle eversion     (Blank rows = not tested)  LOWER EXTREMITY MMT: 4-/5 bilateral knees grossly, 4/5 bilateral hips grossly   MMT Right eval Left eval  Hip flexion    Hip extension    Hip abduction    Hip adduction    Hip internal rotation    Hip external rotation    Knee flexion    Knee extension    Ankle dorsiflexion    Ankle plantarflexion    Ankle inversion    Ankle eversion     (Blank rows = not tested) PALPATION:   General: bilateral adductors and hip flexors were not TTP   Pelvic Alignment: WNL  Abdominal: upper chest breathing, decreased rib excursion with breathing                External Perineal Exam: minimal dryness noted. No significant perineal dropping with cough test.                             Internal Pelvic Floor: generally weak throughout pelvic floor with no pain during examination.  Decreased pelvic floor  AROM with inhalation/exhalation.   Patient confirms identification and approves PT to assess internal pelvic floor and treatment Yes No emotional/communication barriers or cognitive limitation. Patient is motivated to learn. Patient understands and agrees with treatment goals and plan. PT explains patient will be examined in standing, sitting, and lying down to see how their muscles and joints work. When they are ready, they will be asked to remove their underwear so PT can examine their perineum. The patient is also given the option of providing their own chaperone as one is not provided in our facility. The patient also has the right and is explained the right to defer or refuse any part of the evaluation or treatment including the internal exam. With the patient's consent, PT will use one gloved finger to gently assess the muscles of the pelvic floor, seeing how well it contracts and relaxes and if there is muscle symmetry. After, the patient will get dressed and PT and patient will discuss exam findings and plan of care. PT and patient discuss plan of care, schedule, attendance policy and HEP activities.  PELVIC MMT:   MMT eval Reassessment 08/06/23  Vaginal 3/5, 3 sec hold, 10 quick flicks  3+/5, 10 sec hold, 10 quick flicks  Internal Anal Sphincter    External Anal Sphincter    Puborectalis    Diastasis Recti    (Blank rows = not tested)       TONE: WNL  PROLAPSE: N/A  TODAY'S TREATMENT:                                                                                                                              DATE:   07/25/23:  Self care: Relative anatomy, connection between pelvic floor and diaphragm, diaphragmatic breathing and abdominal excursion, vaginal moisturizer list and lubrication education (provided two water based lubrication samples) Urge drill for managing urge incontinence and urgency on the way to the bathroom Neuro re-ed/therapeutic activity SEATED diaphragmatic  breathing + pelvic floor lengthening with inhalation and shortening with exhalation 3x10  SEATED pelvic floor muscle quick flicks + diaphragmatic breathing 2x10  STS + GTB + diaphragmatic breathing 2x10  Seated clamshell + diaphragmatic breathing 2x20 (GTB)  Bridge + GTB around knees + diaphragmatic breathing 2x10  Hooklying opposite knee/hand ball press + diaphragmatic breathing 2x10 (hug the baby cue for contracting transverse abdominis)    07/30/23:  Self care: Relative anatomy, connection between pelvic floor and diaphragm, diaphragmatic breathing and abdominal excursion, vaginal moisturizer list and lubrication education (provided two water based lubrication samples) Urge drill for managing urge incontinence and urgency on the way to the bathroom Neuro re-ed/manual therapy: Internal pelvic floor muscle release in superficial and deep pelvic floor to relieve muscle tension and assess active range of motion  diaphragmatic breathing + pelvic floor lengthening with inhalation and shortening with exhalation 3x10  Verbal review of last week's exercises: SEATED diaphragmatic breathing + pelvic floor lengthening with  inhalation and shortening with exhalation 3x10  SEATED pelvic floor muscle quick flicks + diaphragmatic breathing 2x10  STS + GTB + diaphragmatic breathing 2x10  Seated clamshell + diaphragmatic breathing 2x20 (GTB)  Bridge + GTB around knees + diaphragmatic breathing 2x10  Hooklying opposite knee/hand ball press + diaphragmatic breathing 2x10 (hug the baby cue for contracting transverse abdominis)   08/20/23: Self care: Relative anatomy, connection between pelvic floor and diaphragm, diaphragmatic breathing and abdominal excursion, vaginal moisturizer list and lubrication education (provided two water based lubrication samples) Urge drill for managing urge incontinence and urgency on the way to the bathroom Neuro re-ed/manual therapy: Hip thrust with back on bench + 10# KB +  diaphragmatic breathing 2x10  Side plank (modified) dips + diaphragmatic breathing 2x10 each side  Dead bug (arms and legs) + diaphragmatic breathing 2x10 alternating  Squat with BlueTB around knees + 10# KB + diaphragmatic breathing 2x10  Lateral banded stepping + diaphragmatic breathing (GTB) 2x10   PATIENT EDUCATION:  Education details: Relative anatomy, connection between pelvic floor and diaphragm, diaphragmatic breathing and abdominal excursion, vaginal moisturizer list and lubrication education (provided two water based lubrication samples) Person educated: Patient Education method: Explanation, Demonstration, Tactile cues, Verbal cues, and Handouts Education comprehension: verbalized understanding, returned demonstration, verbal cues required, and tactile cues requiredv  HOME EXERCISE PROGRAM: Access Code: [email or web address] URL: [email or web address] Date: 08/20/2023 Prepared by: Robbin Chill  Exercises - Seated Pelvic Floor Contraction  - 1 x daily - 7 x weekly - 2 sets - 10 reps - Seated Quick Flick Pelvic Floor Contractions  - 1 x daily - 7 x weekly - 2 sets - 10 reps - Supine Hip Extension on Bench  - 1 x daily - 7 x weekly - 2 sets - 10 reps - Side Stepping with Resistance at Thighs  - 1 x daily - 7 x weekly - 2 sets - 10 reps - Goblet Squat with Kettlebell  - 1 x daily - 7 x weekly - 2 sets - 10 reps - Standing Hip Extension with Counter Support  - 1 x daily - 7 x weekly - 2 sets - 10 reps - Supine Dead Bug with Leg Extension  - 1 x daily - 7 x weekly - 2 sets - 10 reps - Bird Dog  - 1 x daily - 7 x weekly - 2 sets - 10 reps - Oblique Crunch  - 1 x daily - 7 x weekly - 2 sets - 10 reps - Sidelying Hip Abduction  - 1 x daily - 7 x weekly - 2 sets - 10 reps - Side Plank on Knees  - 1 x daily - 7 x weekly - 2 sets - 10 reps  ASSESSMENT:  CLINICAL IMPRESSION: Patient is a 38 y.o. female who was seen today for physical therapy treatment for postpartum-related weakness,  pain, and stress urinary incontinence. Patient delivered her second child vaginally 03/25/23 and had tearing with stitches internally. Patient reports 0/10 pain today and no urinary leakage. Patient tolerated all exercise progressions including addition of 10# weight, increased banded resistance, and increased gravitational loading. No increase in pain following today's session and no leakage to report. Pt would benefit from additional PT to further address deficits.       OBJECTIVE IMPAIRMENTS: decreased coordination, decreased endurance, decreased mobility, decreased ROM, decreased strength, and pain.   ACTIVITY LIMITATIONS: continence  PARTICIPATION LIMITATIONS:  exercise including pilates, vaginal penetration due to pain   PERSONAL FACTORS: Past/current experiences are also affecting  patient's functional outcome.   REHAB POTENTIAL: Good  CLINICAL DECISION MAKING: Stable/uncomplicated  EVALUATION COMPLEXITY: Low   GOALS: Goals reviewed with patient? Yes  SHORT TERM GOALS: Target date: 07/31/2023  Pt will be independent with HEP.  Baseline: Goal status: GOAL MET 08/06/23  2.  Pt will be independent with diaphragmatic breathing and down training activities in order to improve pelvic floor relaxation. Baseline:  Goal status: GOAL MET 08/06/23  3.  Pt will be independent with the knack, urge suppression technique, and double voiding in order to improve bladder habits and decrease urinary incontinence.   Baseline:  Goal status: GOAL MET 08/06/23  LONG TERM GOALS: Target date: 12/31/2023  Pt will be independent with advanced HEP.  Baseline:  Goal status: ONGOING 08/06/23  2.  Pt will report 75% reduction of pain due to improvements in posture, strength, and muscle length to improve quality of life and functional mobility. Baseline:  Goal status: ONGOING 08/06/23  3.  Patient will report PFIQ-7 score of 24 points or less to suggest decreased functional limitations secondary to pelvic pain  to improve quality of life. Baseline:  Goal status: ONGOING 08/06/23  4.  Pt will demonstrate normal pelvic floor muscle tone and A/ROM, able to achieve 4/5 strength with contractions and 10 sec endurance, in order to provide appropriate lumbopelvic support in functional activities.   Baseline:  Goal status: ONGOING 08/06/23  PLAN:  PT FREQUENCY: 1x/week  PT DURATION: 8 weeks  PLANNED INTERVENTIONS: 97110-Therapeutic exercises, 97530- Therapeutic activity, 97112- Neuromuscular re-education, 97535- Self Care, [id number]- Manual therapy, Taping, Dry Needling, Joint mobilization, Spinal mobilization, Scar mobilization, Cryotherapy, and Moist heat  PLAN FOR NEXT SESSION: see how pain has been and perform internal for pain relief, progress pelvic floor AROM to seated position, continued lumbopelvic mobility, introduce LE strengthening   Marni Sins, PT 08/20/2023, 1:22 PM

## 2023-08-27 ENCOUNTER — Ambulatory Visit: Payer: Medicaid Other | Admitting: Physical Therapy

## 2023-08-27 DIAGNOSIS — R279 Unspecified lack of coordination: Secondary | ICD-10-CM

## 2023-08-27 DIAGNOSIS — R293 Abnormal posture: Secondary | ICD-10-CM

## 2023-08-27 DIAGNOSIS — M6281 Muscle weakness (generalized): Secondary | ICD-10-CM

## 2023-08-27 NOTE — Therapy (Signed)
 OUTPATIENT PHYSICAL THERAPY FEMALE PELVIC TREATMENT   Patient Name: [name] MRN: [id number] DOB:01-Oct-1985, 38 y.o., female Today's Date: 08/27/2023  END OF SESSION:  PT End of Session - 08/27/23 1312     Visit Number 6    Number of Visits 8    Date for PT Re-Evaluation 07/31/23    Authorization Type Medicaid [employer]    Authorization Time Period RADMD Approved 4 visits-07/03/2023-09/01/2023 auth#[id number]    Authorization - Number of Visits 4    PT Start Time 1230    PT Stop Time 1314    PT Time Calculation (min) 44 min    Activity Tolerance Patient tolerated treatment well    Behavior During Therapy WFL for tasks assessed/performed                 Past Medical History:  Diagnosis Date   Medical history non-contributory    Past Surgical History:  Procedure Laterality Date   DILATION AND CURETTAGE OF UTERUS     Patient Active Problem List   Diagnosis Date Noted   SVD (spontaneous vaginal delivery) 03/24/2023   Vaginal delivery 03/23/2023   Obstetric vaginal laceration 03/23/2023   Anemia complicating pregnancy 01/25/2023   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 12/28/2022   Alpha thalassemia silent carrier 12/28/2022   Fibroid uterus 12/28/2022   PCOS (polycystic ovarian syndrome) 11/30/2022   Old cervical tear 11/30/2022   Supervision of high risk pregnancy, antepartum 11/22/2022   AMA (advanced maternal age) multigravida 35+ 11/22/2022    PCP: Tari Fare, CNM  REFERRING PROVIDER: Salomon Cree, CNM   REFERRING DIAG: Z39.2 (ICD-10-CM) - Postpartum examination following vaginal delivery R32 (ICD-10-CM) - Urinary incontinence, unspecified type  THERAPY DIAG:  Muscle weakness (generalized)  Unspecified lack of coordination  Abnormal posture  Rationale for Evaluation and Treatment: Rehabilitation  ONSET DATE: 03/23/23  SUBJECTIVE:                                                                                                                                                                                            SUBJECTIVE STATEMENT: No pain or bleeding to report. No leakage at all to report. She hasn't had a chance to do her exercises this week.   Patient reports that she is doing well. She reports that her pelvis is feeling good today. She has been having some mommy carpal tunnel when picking up her baby (13 lbs now). She is planning to start cardio this week. No bleeding/spotting this week. No major urinary concerns to report.  PAIN:  Are you having pain? Yes NPRS scale: 7/10 at worst, 5/10 currently  Pain location:  Internal, Deep, Bilateral, Anterior, and Posterior  Pain type: aching Pain description: intermittent and dull   Aggravating factors: standing at work, pumping Relieving factors: resting, heat   PRECAUTIONS: None  RED FLAGS: None   WEIGHT BEARING RESTRICTIONS: No  FALLS:  Has patient fallen in last 6 months? 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   PLOF: Independent  PATIENT GOALS: decreased pain, get stronger  PERTINENT HISTORY:  2 vaginal deliveries  Sexual abuse: No  BOWEL MOVEMENT: Pain with bowel movement: Yes Type of bowel movement:Type (Bristol Stool Scale) 4, Frequency 1-2, Strain sometimes, and Splinting no Fully empty rectum: Yes:   Leakage: No Pads: No Fiber supplement/laxative Yes - stool softener   URINATION: Pain with urination: No Fully empty bladder: Yes:   Stream: Strong Urgency: Yes  Frequency: WNL Leakage: Urge to void and Sneezing Pads: No  INTERCOURSE:  Ability to have vaginal penetration  hasn't attempted intercourse yet  Pain with intercourse:  N/A DrynessNo Climax: usually yes Marinoff Scale: 0/3  PREGNANCY: Vaginal deliveries 2 Tearing Yes: tearing with both that healed well  C-section deliveries  0 Currently pregnant No  PROLAPSE: Pressure with pooping   OBJECTIVE:  Note: Objective measures were completed at Evaluation unless otherwise noted.  PATIENT SURVEYS:   Eval: PFIQ-7 = 48 Reassessment 08/06/23: PFIQ-7: 30  COGNITION: Overall cognitive status: Within functional limits for tasks assessed     SENSATION: Light touch: Appears intact  LUMBAR SPECIAL TESTS:  Single leg stance test: Positive  FUNCTIONAL TESTS:  Squat: lumbopelvic pain and feelings of weakness with transferring   GAIT: Comments: mild trendelenburg gait pattern with ambulation   POSTURE: rounded shoulders, forward head, and increased lumbar lordosis  LUMBARAROM/PROM:   A/PROM A/PROM  eval  Flexion 25% limited  Extension 25% limtied  Right lateral flexion 25% limited  Left lateral flexion 25% limited  Right rotation 25% limited  Left rotation 25% limited   (Blank rows = not tested)  LOWER EXTREMITY ROM: WNL  Active ROM Right eval Left eval  Hip flexion    Hip extension    Hip abduction    Hip adduction    Hip internal rotation    Hip external rotation    Knee flexion    Knee extension    Ankle dorsiflexion    Ankle plantarflexion    Ankle inversion    Ankle eversion     (Blank rows = not tested)  LOWER EXTREMITY MMT: 4-/5 bilateral knees grossly, 4/5 bilateral hips grossly   MMT Right eval Left eval  Hip flexion    Hip extension    Hip abduction    Hip adduction    Hip internal rotation    Hip external rotation    Knee flexion    Knee extension    Ankle dorsiflexion    Ankle plantarflexion    Ankle inversion    Ankle eversion     (Blank rows = not tested) PALPATION:   General: bilateral adductors and hip flexors were not TTP   Pelvic Alignment: WNL  Abdominal: upper chest breathing, decreased rib excursion with breathing                External Perineal Exam: minimal dryness noted. No significant perineal dropping with cough test.  Internal Pelvic Floor: generally weak throughout pelvic floor with no pain during examination. Decreased pelvic floor AROM with inhalation/exhalation.   Patient confirms identification and approves PT to assess internal pelvic floor and treatment Yes No emotional/communication barriers or cognitive limitation. Patient is motivated to learn. Patient understands and agrees with treatment goals and plan. PT explains patient will be examined in standing, sitting, and lying down to see how their muscles and joints work. When they are ready, they will be asked to remove their underwear so PT can examine their perineum. The patient is also given the option of providing their own chaperone as one is not provided in our facility. The patient also has the right and is explained the right to defer or refuse any part of the evaluation or treatment including the internal exam. With the patient's consent, PT will use one gloved finger to gently assess the muscles of the pelvic floor, seeing how well it contracts and relaxes and if there is muscle symmetry. After, the patient will get dressed and PT and patient will discuss exam findings and plan of care. PT and patient discuss plan of care, schedule, attendance policy and HEP activities.  PELVIC MMT:   MMT eval Reassessment 08/06/23  Vaginal 3/5, 3 sec hold, 10 quick flicks  3+/5, 10 sec hold, 10 quick flicks  Internal Anal Sphincter    External Anal Sphincter    Puborectalis    Diastasis Recti    (Blank rows = not tested)       TONE: WNL  PROLAPSE: N/A  TODAY'S TREATMENT:                                                                                                                              DATE:   07/25/23:  Self care: Relative anatomy, connection between pelvic floor and diaphragm, diaphragmatic breathing and abdominal excursion, vaginal moisturizer list and lubrication education (provided two water based lubrication samples) Urge drill for managing  urge incontinence and urgency on the way to the bathroom Neuro re-ed/therapeutic activity SEATED diaphragmatic breathing + pelvic floor lengthening with inhalation and shortening with exhalation 3x10  SEATED pelvic floor muscle quick flicks + diaphragmatic breathing 2x10  STS + GTB + diaphragmatic breathing 2x10  Seated clamshell + diaphragmatic breathing 2x20 (GTB)  Bridge + GTB around knees + diaphragmatic breathing 2x10  Hooklying opposite knee/hand ball press + diaphragmatic breathing 2x10 (hug the baby cue for contracting transverse abdominis)    07/30/23:  Self care: Relative anatomy, connection between pelvic floor and diaphragm, diaphragmatic breathing and abdominal excursion, vaginal moisturizer list and lubrication education (provided two water based lubrication samples) Urge drill for managing urge incontinence and urgency on the way to the bathroom Neuro re-ed/manual therapy: Internal pelvic floor muscle release in superficial and deep pelvic floor to relieve muscle tension and assess active range of motion  diaphragmatic breathing + pelvic floor lengthening with inhalation and shortening with exhalation 3x10  Verbal review  of last week's exercises: SEATED diaphragmatic breathing + pelvic floor lengthening with inhalation and shortening with exhalation 3x10  SEATED pelvic floor muscle quick flicks + diaphragmatic breathing 2x10  STS + GTB + diaphragmatic breathing 2x10  Seated clamshell + diaphragmatic breathing 2x20 (GTB)  Bridge + GTB around knees + diaphragmatic breathing 2x10  Hooklying opposite knee/hand ball press + diaphragmatic breathing 2x10 (hug the baby cue for contracting transverse abdominis)   08/20/23: Therapeutic exercise: Nustep L4 5 mins - PT present to discuss current status  Seated clamshell (GTB) + diaphragmatic breathing 2x15  Self care: Relative anatomy, connection between pelvic floor and diaphragm, diaphragmatic breathing and abdominal excursion,  vaginal moisturizer list and lubrication education (provided two water based lubrication samples) Urge drill for managing urge incontinence and urgency on the way to the bathroom Neuro re-ed/manual therapy: Hip thrust with back on bench + 10# KB + GTB + diaphragmatic breathing 2x10  Side plank (modified) dips + diaphragmatic breathing 2x10 each side  Dead bug (arms and legs) + diaphragmatic breathing 2x10 alternating  Squat with BlueTB around knees + 15# KB + diaphragmatic breathing 2x10  Lateral banded stepping + diaphragmatic breathing (GTB) 2x10   PATIENT EDUCATION:  Education details: Relative anatomy, connection between pelvic floor and diaphragm, diaphragmatic breathing and abdominal excursion, vaginal moisturizer list and lubrication education (provided two water based lubrication samples) Person educated: Patient Education method: Explanation, Demonstration, Tactile cues, Verbal cues, and Handouts Education comprehension: verbalized understanding, returned demonstration, verbal cues required, and tactile cues requiredv  HOME EXERCISE PROGRAM: Access Code: [email or web address] URL: [email or web address] Date: 08/20/2023 Prepared by: Robbin Chill  Exercises - Seated Pelvic Floor Contraction  - 1 x daily - 7 x weekly - 2 sets - 10 reps - Seated Quick Flick Pelvic Floor Contractions  - 1 x daily - 7 x weekly - 2 sets - 10 reps - Supine Hip Extension on Bench  - 1 x daily - 7 x weekly - 2 sets - 10 reps - Side Stepping with Resistance at Thighs  - 1 x daily - 7 x weekly - 2 sets - 10 reps - Goblet Squat with Kettlebell  - 1 x daily - 7 x weekly - 2 sets - 10 reps - Standing Hip Extension with Counter Support  - 1 x daily - 7 x weekly - 2 sets - 10 reps - Supine Dead Bug with Leg Extension  - 1 x daily - 7 x weekly - 2 sets - 10 reps - Bird Dog  - 1 x daily - 7 x weekly - 2 sets - 10 reps - Oblique Crunch  - 1 x daily - 7 x weekly - 2 sets - 10 reps - Sidelying Hip Abduction  - 1 x  daily - 7 x weekly - 2 sets - 10 reps - Side Plank on Knees  - 1 x daily - 7 x weekly - 2 sets - 10 reps  ASSESSMENT:  CLINICAL IMPRESSION: Patient is a 38 y.o. female who was seen today for physical therapy treatment for postpartum-related weakness, pain, and stress urinary incontinence. Patient delivered her second child vaginally 03/25/23 and had tearing with stitches internally. Patient reports 0/10 pain today and no urinary leakage. Since patient was unable to review her HEP much this week, we revisited last week's exercises and increased weight selection accordingly for progressive overload. No increase in pain following today's session and no leakage to report. Patient is tolerating intensity changes very well. Pt would benefit from additional PT  to further address deficits.       OBJECTIVE IMPAIRMENTS: decreased coordination, decreased endurance, decreased mobility, decreased ROM, decreased strength, and pain.   ACTIVITY LIMITATIONS: continence  PARTICIPATION LIMITATIONS:  exercise including pilates, vaginal penetration due to pain   PERSONAL FACTORS: Past/current experiences are also affecting patient's functional outcome.   REHAB POTENTIAL: Good  CLINICAL DECISION MAKING: Stable/uncomplicated  EVALUATION COMPLEXITY: Low   GOALS: Goals reviewed with patient? Yes  SHORT TERM GOALS: Target date: 07/31/2023  Pt will be independent with HEP.  Baseline: Goal status: GOAL MET 08/06/23  2.  Pt will be independent with diaphragmatic breathing and down training activities in order to improve pelvic floor relaxation. Baseline:  Goal status: GOAL MET 08/06/23  3.  Pt will be independent with the knack, urge suppression technique, and double voiding in order to improve bladder habits and decrease urinary incontinence.   Baseline:  Goal status: GOAL MET 08/06/23  LONG TERM GOALS: Target date: 12/31/2023  Pt will be independent with advanced HEP.  Baseline:  Goal status: ONGOING  08/06/23  2.  Pt will report 75% reduction of pain due to improvements in posture, strength, and muscle length to improve quality of life and functional mobility. Baseline:  Goal status: ONGOING 08/06/23  3.  Patient will report PFIQ-7 score of 24 points or less to suggest decreased functional limitations secondary to pelvic pain to improve quality of life. Baseline:  Goal status: ONGOING 08/06/23  4.  Pt will demonstrate normal pelvic floor muscle tone and A/ROM, able to achieve 4/5 strength with contractions and 10 sec endurance, in order to provide appropriate lumbopelvic support in functional activities.   Baseline:  Goal status: ONGOING 08/06/23  PLAN:  PT FREQUENCY: 1x/week  PT DURATION: 8 weeks  PLANNED INTERVENTIONS: 97110-Therapeutic exercises, 97530- Therapeutic activity, 97112- Neuromuscular re-education, 97535- Self Care, [id number]- Manual therapy, Taping, Dry Needling, Joint mobilization, Spinal mobilization, Scar mobilization, Cryotherapy, and Moist heat  PLAN FOR NEXT SESSION: see how pain has been and perform internal for pain relief, progress pelvic floor AROM to seated position, continued lumbopelvic mobility, introduce LE strengthening   Marni Sins, PT 08/27/2023, 1:12 PM

## 2023-09-30 ENCOUNTER — Telehealth: Admitting: Physician Assistant

## 2023-09-30 ENCOUNTER — Telehealth

## 2023-09-30 DIAGNOSIS — Z91199 Patient's noncompliance with other medical treatment and regimen due to unspecified reason: Secondary | ICD-10-CM

## 2023-09-30 NOTE — Progress Notes (Signed)
 Patient arrived before appointment time, but did not show despite sending links for the appointment. Patient had also been advised prior to the appointment she should be seen in person since she had a concern for sepsis. Will mark no show, but No Charge.

## 2023-10-02 ENCOUNTER — Ambulatory Visit: Admitting: Physical Therapy

## 2024-01-02 ENCOUNTER — Telehealth: Payer: Self-pay | Admitting: Family Medicine

## 2024-01-02 NOTE — Telephone Encounter (Signed)
 Patient called to be scheduled for a an appointment after having some concerning symptoms. She has been experiencing dizziness, irregular blood pressure, unbalanced hormones, and fatigue. She said she stopped breastfeeding about 3 months ago and has been waiting to feel normal but hasn't. She want's to be seen as soon as possible to figure out what is going on.

## 2024-01-03 NOTE — Telephone Encounter (Signed)
 Called pt and discussed her concerns. She stated that she has had various issues within the past couple weeks. She had a possible anxiety type episode 1-2 weeks ago. Then just the other [name] she was driving and had a sudden pain in her neck and shoulder followed by H/A. During this time she had light and sound sensitivity as well. She was able to pull off the road and within 30-45 minutes, the sensations passed. At times she has felt shaky and like she was going to faint. Pt denies any of these feelings @ present. She has been given first available office appointment on 10/16. Pt was instructed to go to Urgent Care facility if any of these issues reoccur. If after hours, she should go to ED for evaluation. Pt was advised of importance of evaluation during or soon after one of these episodes occurs. She voiced understanding.

## 2024-02-20 ENCOUNTER — Other Ambulatory Visit: Payer: Self-pay

## 2024-02-20 ENCOUNTER — Ambulatory Visit (INDEPENDENT_AMBULATORY_CARE_PROVIDER_SITE_OTHER): Admitting: Obstetrics and Gynecology

## 2024-02-20 ENCOUNTER — Encounter: Payer: Self-pay | Admitting: Obstetrics and Gynecology

## 2024-02-20 VITALS — BP 114/77 | HR 68 | Ht 72.0 in | Wt 208.6 lb

## 2024-02-20 DIAGNOSIS — N920 Excessive and frequent menstruation with regular cycle: Secondary | ICD-10-CM

## 2024-02-20 DIAGNOSIS — L680 Hirsutism: Secondary | ICD-10-CM | POA: Diagnosis not present

## 2024-02-20 DIAGNOSIS — Z86018 Personal history of other benign neoplasm: Secondary | ICD-10-CM | POA: Diagnosis not present

## 2024-02-20 NOTE — Progress Notes (Signed)
   GYNECOLOGY PROGRESS NOTE  History:  38 y.o. [id number] presents to [facility] office today for problem gyn visit.  Has history of fibroid during pregnancy, concerned about that. Reports heavier bleeding  Before pregnancy, no cramps, bleeding normal. Now reports heavy bleeding and clotting, changing every hour in the beginning of period, not always soaking through. Also reports dysmenorrhea   Wants to check her hormones, feels things have been off since starting birth control and delivery. Has been on mirena, implant, pills and shot in the past. At one point during hormonal birth control started having excessive facial hair growth.   The following portions of the patient's history were reviewed and updated as appropriate: allergies, current medications, past family history, past medical history, past social history, past surgical history and problem list. Last pap smear on 11/30/22 was normal  Health Maintenance Due  Topic Date Due   Hepatitis B Vaccines 19-59 Average Risk (1 of 3 - 19+ 3-dose series) Never done   HPV VACCINES (1 - 3-dose SCDM series) Never done   Influenza Vaccine  Never done   COVID-19 Vaccine (1 - 2025-26 season) Never done     Review of Systems:  Pertinent items are noted in HPI.   Objective:  Physical Exam Blood pressure 114/77, pulse 68, height 6' (1.829 m), weight 208 lb 9.6 oz (94.6 kg), last menstrual period 02/05/2024, currently breastfeeding. VS reviewed, nursing note reviewed,  Constitutional: well developed, well nourished, no distress HEENT: normocephalic Pulm/chest wall: normal effort Neuro: alert and oriented  Skin: warm, dry Psych: affect normal Pelvic exam: deferred  Assessment & Plan:  1. Menorrhagia with regular cycle (Primary) Discussed different options for management to include hormonal and non hormonal management. Desires work up first  - CBC - TSH Rfx on Abnormal to Free T4 - Testosterone,Free and Total  2. Excessive growth of facial  hair Discussed option for management such as hormonal birth control vs antiandrogen - Testosterone,Free and Total  3. History of uterine fibroid Desires repeat ultrasound - US  PELVIC COMPLETE WITH TRANSVAGINAL; Future   Nidia Daring, FNP

## 2024-02-20 NOTE — Patient Instructions (Signed)
 [email or web address]

## 2024-02-21 ENCOUNTER — Ambulatory Visit: Payer: Self-pay | Admitting: Obstetrics and Gynecology

## 2024-02-21 LAB — CBC
Hematocrit: 37.3 % (ref 34.0–46.6)
Hemoglobin: 11.5 g/dL (ref 11.1–15.9)
MCH: 25.5 pg — ABNORMAL LOW (ref 26.6–33.0)
MCHC: 30.8 g/dL — ABNORMAL LOW (ref 31.5–35.7)
MCV: 83 fL (ref 79–97)
Platelets: 194 x10E3/uL (ref 150–450)
RBC: 4.51 x10E6/uL (ref 3.77–5.28)
RDW: 13.3 % (ref 11.7–15.4)
WBC: 4.8 x10E3/uL (ref 3.4–10.8)

## 2024-02-21 LAB — TESTOSTERONE,FREE AND TOTAL
Testosterone, Free: 0.5 pg/mL (ref 0.0–4.2)
Testosterone: 19 ng/dL (ref 8–60)

## 2024-02-21 LAB — TSH RFX ON ABNORMAL TO FREE T4: TSH: 0.984 u[IU]/mL (ref 0.450–4.500)

## 2024-02-25 ENCOUNTER — Ambulatory Visit (HOSPITAL_COMMUNITY)
Admission: RE | Admit: 2024-02-25 | Discharge: 2024-02-25 | Disposition: A | Discharge status: Home / Self Care | Source: Ambulatory Visit | Attending: Obstetrics and Gynecology | Admitting: Obstetrics and Gynecology

## 2024-02-25 DIAGNOSIS — Z86018 Personal history of other benign neoplasm: Secondary | ICD-10-CM | POA: Insufficient documentation

## 2024-02-29 ENCOUNTER — Emergency Department (HOSPITAL_BASED_OUTPATIENT_CLINIC_OR_DEPARTMENT_OTHER): Admitting: Radiology

## 2024-02-29 ENCOUNTER — Other Ambulatory Visit: Payer: Self-pay

## 2024-02-29 ENCOUNTER — Emergency Department (HOSPITAL_BASED_OUTPATIENT_CLINIC_OR_DEPARTMENT_OTHER)
Admission: EM | Admit: 2024-02-29 | Discharge: 2024-02-29 | Disposition: A | Discharge status: Home / Self Care | Attending: Emergency Medicine | Admitting: Emergency Medicine

## 2024-02-29 DIAGNOSIS — S42202A Unspecified fracture of upper end of left humerus, initial encounter for closed fracture: Secondary | ICD-10-CM | POA: Insufficient documentation

## 2024-02-29 DIAGNOSIS — W182XXA Fall in (into) shower or empty bathtub, initial encounter: Secondary | ICD-10-CM | POA: Insufficient documentation

## 2024-02-29 MED ORDER — OXYCODONE-ACETAMINOPHEN 5-325 MG PO TABS
1.0000 | ORAL_TABLET | ORAL | Status: DC | PRN
  Administered 2024-02-29: 1 via ORAL
  Filled 2024-02-29: qty 1

## 2024-02-29 NOTE — ED Triage Notes (Signed)
 Pt slipped in shower and hurt left arm. Pt has pain from shoulder to thumb. Pt is able to hand arm down but unable to move arm

## 2024-02-29 NOTE — Discharge Instructions (Addendum)
 Make an appointment to have close follow-up with the orthopedist.  Return to the emergency room if you have any worsening symptoms.

## 2024-02-29 NOTE — ED Provider Notes (Signed)
 [place] EMERGENCY DEPARTMENT AT [facility] Provider Note   CSN: [id number] Arrival date & time: 02/29/24  2003     Patient presents with: [name] is a 38 y.o. female.   Patient is a 38 year old female who presents with shoulder pain after a fall.  She was in the shower this evening and slipped.  She tried to stabilize herself by putting her left arm on the wall and it jerked her arm causing pain from her shoulder down to her elbow.  She denies any other injuries.  She has had constant throbbing pain in her left shoulder.  It radiates down to her hand.  No numbness or weakness in the hand.       Prior to Admission medications   Medication Sig Start Date End Date Taking? Authorizing Provider  ibuprofen  (ADVIL ) 600 MG tablet Take 1 tablet (600 mg total) by mouth every 6 (six) hours. 03/25/23   Ozan, Jennifer, DO    Allergies: Sulfa antibiotics and Tomato    Review of Systems  Constitutional:  Negative for fever.  Gastrointestinal:  Negative for nausea and vomiting.  Musculoskeletal:  Positive for arthralgias and joint swelling. Negative for back pain and neck pain.  Skin:  Negative for wound.  Neurological:  Negative for weakness, numbness and headaches.    Updated Vital Signs BP 121/77 (BP Location: Right Arm)   Pulse 81   Temp 98.1 F (36.7 C)   Resp 16   Ht 6' (1.829 m)   Wt 94.3 kg   LMP 02/05/2024 (Approximate)   SpO2 100%   BMI 28.21 kg/m   Physical Exam Constitutional:      Appearance: She is well-developed.  HENT:     Head: Normocephalic and atraumatic.  Cardiovascular:     Rate and Rhythm: Normal rate.  Pulmonary:     Effort: Pulmonary effort is normal.  Musculoskeletal:        General: Tenderness present.     Cervical back: Normal range of motion and neck supple.     Comments: Positive tenderness throughout the left shoulder.  No obvious deformity.  There is tenderness along the upper arm.  No discrete pain to the elbow.   No pain to the forearm.  Radial pulses intact.  She has normal sensation and motor function distally in the hand.  No wounds.  Skin:    General: Skin is warm and dry.  Neurological:     Mental Status: She is alert and oriented to person, place, and time.     (all labs ordered are listed, but only abnormal results are displayed) Labs Reviewed - No data to display  EKG: None  Radiology: DG Shoulder Left Result Date: 02/29/2024 EXAM: 3 VIEW XRAY OF THE LEFT SHOULDER 02/29/2024 09:25:00 PM COMPARISON: None available. CLINICAL HISTORY: fall, shoulder pain. Pt slipped in shower and hurt left arm. Pt has pain from shoulder to elbow. FINDINGS: BONES AND JOINTS: Glenohumeral joint is normally aligned. Nondisplaced greater tuberosity fracture. The [facility] joint is unremarkable in appearance. SOFT TISSUES: No abnormal calcifications. Visualized lung is unremarkable. IMPRESSION: 1. Nondisplaced greater tuberosity fracture. Electronically signed by: Pinkie Pebbles MD 02/29/2024 09:36 PM EDT RP Workstation: [id number]   DG Humerus Left Result Date: 02/29/2024 EXAM: 4 VIEW(S) XRAY OF THE LEFT HUMERUS 02/29/2024 09:25:00 PM COMPARISON: None available. CLINICAL HISTORY: pain. Table formatting from the original note was not included.; Pt slipped in shower and hurt left arm. Pt has pain from shoulder to elbow. FINDINGS:  BONES AND JOINTS: Nondisplaced greater tuberosity fracture. No joint dislocation. SOFT TISSUES: The soft tissues are unremarkable. IMPRESSION: 1. Nondisplaced greater tuberosity fracture. Electronically signed by: Pinkie Pebbles MD 02/29/2024 09:35 PM EDT RP Workstation: [id number]     Procedures   Medications Ordered in the ED  oxyCODONE -acetaminophen  (PERCOCET/ROXICET) 5-325 MG per tablet 1 tablet (1 tablet Oral Given 02/29/24 2019)                                    Medical Decision Making Amount and/or Complexity of Data Reviewed Radiology: ordered.  Risk Prescription drug  management.   Patient is a 38 year old who presents after mechanical fall.  She has pain in her left shoulder and down her left arm.  X-rays were obtained which were interpreted by me confirmed by the radiologist to show fracture of the greater tuberosity of the proximal humerus.  No other fractures identified.  No dislocation.  She was placed in a sling.  Was advised on ice and elevation.  Was advised to alternate Tylenol  and ibuprofen  for symptomatic relief.  Was given referral to follow-up with orthopedic surgery.  Return precautions were given.     Final diagnoses:  Closed fracture of proximal end of left humerus, unspecified fracture morphology, initial encounter    ED Discharge Orders     None          Lenor Hollering, MD 02/29/24 2302

## 2024-03-05 ENCOUNTER — Emergency Department (HOSPITAL_BASED_OUTPATIENT_CLINIC_OR_DEPARTMENT_OTHER): Admitting: Radiology

## 2024-03-05 ENCOUNTER — Emergency Department (HOSPITAL_BASED_OUTPATIENT_CLINIC_OR_DEPARTMENT_OTHER)
Admission: EM | Admit: 2024-03-05 | Discharge: 2024-03-05 | Disposition: A | Discharge status: Home / Self Care | Attending: Emergency Medicine | Admitting: Emergency Medicine

## 2024-03-05 ENCOUNTER — Encounter (HOSPITAL_BASED_OUTPATIENT_CLINIC_OR_DEPARTMENT_OTHER): Payer: Self-pay | Admitting: Emergency Medicine

## 2024-03-05 ENCOUNTER — Other Ambulatory Visit: Payer: Self-pay

## 2024-03-05 DIAGNOSIS — M25532 Pain in left wrist: Secondary | ICD-10-CM | POA: Insufficient documentation

## 2024-03-05 MED ORDER — PREDNISONE 10 MG PO TABS: 20.0000 mg | ORAL_TABLET | Freq: Two times a day (BID) | ORAL | 0 refills | Status: AC

## 2024-03-05 MED ORDER — PREDNISONE 20 MG PO TABS
40.0000 mg | ORAL_TABLET | Freq: Once | ORAL | Status: AC
  Administered 2024-03-05: 40 mg via ORAL
  Filled 2024-03-05: qty 2

## 2024-03-05 NOTE — ED Provider Notes (Signed)
 [place] [facility] AT [facility] Provider Note   CSN: [id number] Arrival date & time: 03/05/24  0330     Patient presents with: Wrist Pain   [name] is a 38 y.o. female.   Patient is a 38 year old female presenting with severe left wrist pain.  She reports falling Saturday night.  She had x-rays of her shoulder showing an avulsion fracture off of her proximal humerus.  She was placed in an arm sling and discharged to home.  She returns tonight stating that she is now having severe pain in her left wrist.  This began in the absence of any new injury or trauma.  She describes pain with any range of motion.  No fevers or chills.         Prior to Admission medications   Medication Sig Start Date End Date Taking? Authorizing Provider  ibuprofen  (ADVIL ) 600 MG tablet Take 1 tablet (600 mg total) by mouth every 6 (six) hours. 03/25/23   Ozan, Jennifer, DO    Allergies: Sulfa antibiotics and Tomato    Review of Systems  All other systems reviewed and are negative.   Updated Vital Signs BP 120/79 (BP Location: Right Arm)   Pulse 76   Temp 97.6 F (36.4 C) (Oral)   Resp 20   Ht 6' (1.829 m)   Wt 94.3 kg   LMP 02/05/2024 (Approximate)   SpO2 100%   BMI 28.21 kg/m   Physical Exam Vitals and nursing note reviewed.  Constitutional:      Appearance: Normal appearance.  Pulmonary:     Effort: Pulmonary effort is normal.  Musculoskeletal:     Comments: The left wrist appears grossly normal.  There is no significant swelling noted.  Capillary refill is brisk to all fingers.  Ulnar and radial pulses are easily palpable.  Skin:    General: Skin is warm and dry.  Neurological:     Mental Status: She is alert and oriented to person, place, and time.     (all labs ordered are listed, but only abnormal results are displayed) Labs Reviewed - No data to display  EKG: None  Radiology: DG Wrist Complete Left Result Date: 03/05/2024 EXAM: 3 OR  MORE VIEW(S) XRAY OF THE LEFT WRIST 03/05/2024 04:01:31 AM COMPARISON: None available. CLINICAL HISTORY: wrist pain/swelling. FINDINGS: BONES AND JOINTS: No acute fracture. No focal osseous lesion. No joint dislocation. SOFT TISSUES: There is mild soft tissue swelling along the dorsum of the wrist. IMPRESSION: 1. Mild soft tissue swelling along the dorsum of the left wrist. Electronically signed by: Evalene Coho MD 03/05/2024 04:07 AM EDT RP Workstation: [id number]     Procedures   Medications Ordered in the ED  predniSONE (DELTASONE) tablet 40 mg (has no administration in time range)                                    Medical Decision Making Amount and/or Complexity of Data Reviewed Radiology: ordered.  Risk Prescription drug management.   Patient presenting with left wrist pain 5 days after suffering an avulsion fracture of her left proximal humerus.  She does have significant discomfort with range of motion of the wrist.  X-rays tonight are negative.  The cause of her wrist discomfort is unclear, but I highly doubt something emergent such as a septic joint.  Perhaps she has gout related to the trauma.  I will treat with  a trial of prednisone and have her follow back up with orthopedics if her symptoms persist.     Final diagnoses:  None    ED Discharge Orders     None          Geroldine Berg, MD 03/05/24 [phone]

## 2024-03-05 NOTE — ED Triage Notes (Signed)
 Patient reports fall on 10/25 where she had injured her left arm, including fractures in left shoulder. Now she is having worsening pain and swelling in left wrist, unrelieved by pain medications prescribed.

## 2024-03-05 NOTE — Discharge Instructions (Addendum)
 Wear wrist splint for comfort and support.  Begin taking prednisone as prescribed.  Continue pain medications as previously prescribed.  Follow-up with orthopedics if not improving in the next few days.

## 2024-05-27 ENCOUNTER — Ambulatory Visit: Admitting: Certified Nurse Midwife
